# Patient Record
Sex: Male | Born: 1975 | Race: Black or African American | Hispanic: No | Marital: Single | Smoking: Current every day smoker
Health system: Southern US, Community
[De-identification: ages and names within clinical notes are randomized; demographics above are authoritative.]

## PROBLEM LIST (undated history)

## (undated) DIAGNOSIS — L309 Dermatitis, unspecified: Secondary | ICD-10-CM

## (undated) DIAGNOSIS — N200 Calculus of kidney: Secondary | ICD-10-CM

---

## 2004-04-22 ENCOUNTER — Emergency Department (HOSPITAL_COMMUNITY): Admission: EM | Admit: 2004-04-22 | Discharge: 2004-04-22 | Payer: Self-pay | Admitting: Emergency Medicine

## 2004-12-20 ENCOUNTER — Emergency Department (HOSPITAL_COMMUNITY): Admission: EM | Admit: 2004-12-20 | Discharge: 2004-12-20 | Payer: Self-pay | Admitting: Emergency Medicine

## 2004-12-24 ENCOUNTER — Emergency Department (HOSPITAL_COMMUNITY): Admission: EM | Admit: 2004-12-24 | Discharge: 2004-12-24 | Payer: Self-pay | Admitting: Emergency Medicine

## 2009-07-12 ENCOUNTER — Emergency Department (HOSPITAL_COMMUNITY): Admission: EM | Admit: 2009-07-12 | Discharge: 2009-07-12 | Payer: Self-pay | Admitting: Emergency Medicine

## 2010-04-12 ENCOUNTER — Inpatient Hospital Stay (INDEPENDENT_AMBULATORY_CARE_PROVIDER_SITE_OTHER)
Admission: RE | Admit: 2010-04-12 | Discharge: 2010-04-12 | Disposition: A | Discharge status: Home / Self Care | Payer: Self-pay | Source: Ambulatory Visit | Attending: Family Medicine | Admitting: Family Medicine

## 2010-04-12 DIAGNOSIS — S335XXA Sprain of ligaments of lumbar spine, initial encounter: Secondary | ICD-10-CM

## 2010-06-21 ENCOUNTER — Inpatient Hospital Stay (INDEPENDENT_AMBULATORY_CARE_PROVIDER_SITE_OTHER)
Admission: RE | Admit: 2010-06-21 | Discharge: 2010-06-21 | Disposition: A | Discharge status: Home / Self Care | Payer: Self-pay | Source: Ambulatory Visit | Attending: Emergency Medicine | Admitting: Emergency Medicine

## 2010-06-21 DIAGNOSIS — M722 Plantar fascial fibromatosis: Secondary | ICD-10-CM

## 2010-08-25 ENCOUNTER — Ambulatory Visit (INDEPENDENT_AMBULATORY_CARE_PROVIDER_SITE_OTHER): Payer: Self-pay

## 2010-08-25 ENCOUNTER — Inpatient Hospital Stay (INDEPENDENT_AMBULATORY_CARE_PROVIDER_SITE_OTHER)
Admission: RE | Admit: 2010-08-25 | Discharge: 2010-08-25 | Disposition: A | Discharge status: Home / Self Care | Payer: Self-pay | Source: Ambulatory Visit | Attending: Emergency Medicine | Admitting: Emergency Medicine

## 2010-08-25 DIAGNOSIS — S335XXA Sprain of ligaments of lumbar spine, initial encounter: Secondary | ICD-10-CM

## 2011-09-29 ENCOUNTER — Encounter (HOSPITAL_COMMUNITY): Payer: Self-pay | Admitting: *Deleted

## 2011-09-29 ENCOUNTER — Emergency Department (INDEPENDENT_AMBULATORY_CARE_PROVIDER_SITE_OTHER)
Admission: EM | Admit: 2011-09-29 | Discharge: 2011-09-29 | Disposition: A | Discharge status: Home / Self Care | Payer: Self-pay | Source: Home / Self Care | Attending: Family Medicine | Admitting: Family Medicine

## 2011-09-29 DIAGNOSIS — L301 Dyshidrosis [pompholyx]: Secondary | ICD-10-CM

## 2011-09-29 MED ORDER — TRIAMCINOLONE ACETONIDE 0.5 % EX CREA: TOPICAL_CREAM | Freq: Three times a day (TID) | CUTANEOUS | Status: DC

## 2011-09-29 NOTE — ED Notes (Signed)
Pt reports rash to both hands that started over a month ago - some relief with prescription cream given to him by friend - no relief with over the counter remedies.

## 2011-09-29 NOTE — ED Provider Notes (Signed)
History     CSN: [id number]  Arrival date & time 09/29/11  1702   First MD Initiated Contact with Patient 09/29/11 1850      Chief Complaint  Patient presents with  . 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  Patient is a 36 y.o. male presenting with rash. The history is provided by the patient. No language interpreter was used.  Rash  This is a new problem. Episode onset: ~ 1 month ago. The problem has not changed since onset.The problem is associated with nothing. There has been no fever. Affected Location: both hands. The patient is experiencing no pain. Associated symptoms include blisters and itching. Pertinent negatives include no pain and no weeping. Treatments tried: fungal /prednisone cream from a friend provides temporary relief. The treatment provided moderate relief.    History reviewed. No pertinent past medical history.  History reviewed. No pertinent past surgical history.  Family History  Problem Relation Age of Onset  . Family history unknown: Yes    History  Substance Use Topics  . Smoking status: Heavy Tobacco Smoker -- 1.0 packs/day  . Smokeless tobacco: Not on file  . Alcohol Use: Yes     socially      Review of Systems  Constitutional: Negative for fever and chills.  Skin: Positive for itching and rash.  All other systems reviewed and are negative.    Allergies  Penicillins  Home Medications   Current Outpatient Rx  Name Route Sig Dispense Refill  . TRIAMCINOLONE ACETONIDE 0.5 % EX CREA Topical Apply topically 3 (three) times daily. 30 g 0    BP 106/56  Pulse 50  Temp 98.2 F (36.8 C) (Oral)  Resp 16  SpO2 100%  Physical Exam  Nursing note and vitals reviewed. Constitutional: He is oriented to person, place, and time. He appears well-developed and well-nourished.  HENT:  Head: Normocephalic and atraumatic.    Eyes: EOM are normal.  Neck: Normal range of motion.  Cardiovascular: Normal rate, regular rhythm, normal heart sounds and intact distal pulses.   Pulmonary/Chest: Effort normal and breath sounds normal. No respiratory distress.  Abdominal: Soft. He exhibits no distension. There is no tenderness.  Musculoskeletal: Normal range of motion.       Scattered groupings of tiny blistery lesions and areas of thickened skin   Neurological: He is alert and oriented to person, place, and time.  Skin: Skin is warm and dry.  Psychiatric: He has a normal mood and affect. Judgment normal.    ED Course  Procedures (including critical care time)  Labs Reviewed - No data to display No results found.   1. Dyshidrosis       MDM  Triamcinolone cream, 0.5%, 30 gm F/u with tafeen prn         Evalina Field, PA 09/29/11 1940

## 2011-10-03 NOTE — ED Provider Notes (Signed)
Medical screening examination/treatment/procedure(s) were performed by resident physician or non-physician practitioner and as supervising physician I was immediately available for consultation/collaboration.   Jacquelynn Friend DOUGLAS MD.    Marshell Dilauro D Shalese Strahan, MD 10/03/11 0929 

## 2012-01-23 ENCOUNTER — Encounter (HOSPITAL_COMMUNITY): Payer: Self-pay | Admitting: Emergency Medicine

## 2012-01-23 ENCOUNTER — Emergency Department (INDEPENDENT_AMBULATORY_CARE_PROVIDER_SITE_OTHER)
Admission: EM | Admit: 2012-01-23 | Discharge: 2012-01-23 | Disposition: A | Discharge status: Home / Self Care | Payer: Self-pay | Source: Home / Self Care

## 2012-01-23 DIAGNOSIS — L301 Dyshidrosis [pompholyx]: Secondary | ICD-10-CM

## 2012-01-23 MED ORDER — TRIAMCINOLONE ACETONIDE 0.1 % EX CREA: TOPICAL_CREAM | Freq: Two times a day (BID) | CUTANEOUS | Status: DC

## 2012-01-23 NOTE — ED Provider Notes (Signed)
Medical screening examination/treatment/procedure(s) were performed by resident physician or non-physician practitioner and as supervising physician I was immediately available for consultation/collaboration.   Roquel Burgin DOUGLAS MD.    Garret Teale D Yandel Zeiner, MD 01/23/12 2028 

## 2012-01-23 NOTE — ED Notes (Signed)
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

## 2012-01-23 NOTE — ED Provider Notes (Signed)
History     CSN: [id number]  Arrival date & time 01/23/12  1358   First MD Initiated Contact with Patient 01/23/12 1447      Chief Complaint  Patient presents with  . Rash    (Consider location/radiation/quality/duration/timing/severity/associated sxs/prior treatment) HPI Comments: 37 year old man with recurrent and rash. States his been diagnosed with a type of eczema in the past. 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 He has since quit that job he presents now with a scaly slightly red rash in the web spaces in the sides of the digits and the extensor surface of a couple days. Is associated with minor itching. There is no drainage or signs of infection   History reviewed. No pertinent past medical history.  History reviewed. No pertinent past surgical history.  No family history on file.  History  Substance Use Topics  . Smoking status: Heavy Tobacco Smoker -- 1.0 packs/day  . Smokeless tobacco: Not on file  . Alcohol Use: Yes     Comment: socially      Review of Systems  All other systems reviewed and are negative.    Allergies  Penicillins  Home Medications   Current Outpatient Rx  Name  Route  Sig  Dispense  Refill  . TRIAMCINOLONE ACETONIDE 0.1 % EX CREA   Topical   Apply topically 2 (two) times daily.   45 g   0   . TRIAMCINOLONE ACETONIDE 0.5 % EX CREA   Topical   Apply topically 3 (three) times daily.   30 g   0     BP 118/67  Pulse 60  Temp 98.4 F (36.9 C) (Oral)  Resp 18  SpO2 98%  Physical Exam  Constitutional: He is oriented to person, place, and time. He appears well-developed and well-nourished. No distress.  Pulmonary/Chest: Effort normal.  Musculoskeletal: Normal range of motion.  Neurological: He is alert and oriented to person, place, and time.  Skin: Skin is warm and dry. Rash noted.  Psychiatric: He has a normal mood and affect.    ED Course  Procedures (including critical care time)  Labs  Reviewed - No data to display No results found.   1. Dyshidrotic eczema       MDM  Triamcinolone cream 0.1% apply to hands twice a day Followup with a PCP for possible referral to dermatology to the       Hayden Rasmussen, NP 01/23/12 1551

## 2013-07-13 ENCOUNTER — Emergency Department (HOSPITAL_COMMUNITY): Payer: Self-pay

## 2013-07-13 ENCOUNTER — Emergency Department (HOSPITAL_COMMUNITY)
Admission: EM | Admit: 2013-07-13 | Discharge: 2013-07-13 | Disposition: A | Discharge status: Home / Self Care | Payer: Self-pay | Attending: Emergency Medicine | Admitting: Emergency Medicine

## 2013-07-13 ENCOUNTER — Encounter (HOSPITAL_COMMUNITY): Payer: Self-pay | Admitting: Emergency Medicine

## 2013-07-13 DIAGNOSIS — Z880 Allergy status to penicillin: Secondary | ICD-10-CM | POA: Insufficient documentation

## 2013-07-13 DIAGNOSIS — F17200 Nicotine dependence, unspecified, uncomplicated: Secondary | ICD-10-CM | POA: Insufficient documentation

## 2013-07-13 DIAGNOSIS — IMO0002 Reserved for concepts with insufficient information to code with codable children: Secondary | ICD-10-CM | POA: Insufficient documentation

## 2013-07-13 DIAGNOSIS — R1032 Left lower quadrant pain: Secondary | ICD-10-CM | POA: Insufficient documentation

## 2013-07-13 DIAGNOSIS — N200 Calculus of kidney: Secondary | ICD-10-CM | POA: Insufficient documentation

## 2013-07-13 LAB — URINALYSIS, ROUTINE W REFLEX MICROSCOPIC
Bilirubin Urine: NEGATIVE
GLUCOSE, UA: NEGATIVE mg/dL
KETONES UR: NEGATIVE mg/dL
Leukocytes, UA: NEGATIVE
NITRITE: NEGATIVE
Protein, ur: NEGATIVE mg/dL
Specific Gravity, Urine: 1.013 (ref 1.005–1.030)
UROBILINOGEN UA: 0.2 mg/dL (ref 0.0–1.0)
pH: 6 (ref 5.0–8.0)

## 2013-07-13 LAB — I-STAT CHEM 8, ED
BUN: 15 mg/dL (ref 6–23)
CALCIUM ION: 1.14 mmol/L (ref 1.12–1.23)
CHLORIDE: 104 meq/L (ref 96–112)
CREATININE: 1.1 mg/dL (ref 0.50–1.35)
GLUCOSE: 114 mg/dL — AB (ref 70–99)
HCT: 49 % (ref 39.0–52.0)
Hemoglobin: 16.7 g/dL (ref 13.0–17.0)
POTASSIUM: 3.6 meq/L — AB (ref 3.7–5.3)
SODIUM: 143 meq/L (ref 137–147)
TCO2: 23 mmol/L (ref 0–100)

## 2013-07-13 LAB — URINE MICROSCOPIC-ADD ON

## 2013-07-13 MED ORDER — SODIUM CHLORIDE 0.9 % IV BOLUS (SEPSIS)
1000.0000 mL | Freq: Once | INTRAVENOUS | Status: AC
  Administered 2013-07-13: 1000 mL via INTRAVENOUS

## 2013-07-13 MED ORDER — ONDANSETRON 4 MG PO TBDP: ORAL_TABLET | ORAL | Status: DC

## 2013-07-13 MED ORDER — MORPHINE SULFATE 4 MG/ML IJ SOLN
4.0000 mg | Freq: Once | INTRAMUSCULAR | Status: AC
  Administered 2013-07-13: 4 mg via INTRAVENOUS
  Filled 2013-07-13: qty 1

## 2013-07-13 MED ORDER — ONDANSETRON HCL 4 MG/2ML IJ SOLN
4.0000 mg | Freq: Once | INTRAMUSCULAR | Status: AC
  Administered 2013-07-13: 4 mg via INTRAVENOUS
  Filled 2013-07-13: qty 2

## 2013-07-13 MED ORDER — OXYCODONE-ACETAMINOPHEN 5-325 MG PO TABS: 1.0000 | ORAL_TABLET | Freq: Four times a day (QID) | ORAL | Status: DC | PRN

## 2013-07-13 MED ORDER — TAMSULOSIN HCL 0.4 MG PO CAPS: 0.4000 mg | ORAL_CAPSULE | Freq: Every day | ORAL | Status: AC

## 2013-07-13 MED ORDER — NAPROXEN 500 MG PO TABS: 500.0000 mg | ORAL_TABLET | Freq: Two times a day (BID) | ORAL | Status: DC | PRN

## 2013-07-13 MED ORDER — KETOROLAC TROMETHAMINE 30 MG/ML IJ SOLN
30.0000 mg | Freq: Once | INTRAMUSCULAR | Status: AC
  Administered 2013-07-13: 30 mg via INTRAVENOUS
  Filled 2013-07-13: qty 1

## 2013-07-13 NOTE — ED Provider Notes (Signed)
Care assumed from [facility] Damm[facility], PA-C. Awaiting CT to r/o kidney stone.  Physical Exam  BP 121/60  Pulse 54  Temp(Src) 97.8 F (36.6 C) (Oral)  Resp 14  SpO2 100%  Physical Exam Gen: Resting in bed, in NAD. HEENT: [place]/AT Heart: Reg rate, distal pulses intact Lungs: Effort normal Abd: soft, NT/ND  ED Course  Procedures  Results for orders placed during the hospital encounter of 07/13/13  URINALYSIS, ROUTINE W REFLEX MICROSCOPIC      Result Value Ref Range   Color, Urine YELLOW  YELLOW   APPearance CLEAR  CLEAR   Specific Gravity, Urine 1.013  1.005 - 1.030   pH 6.0  5.0 - 8.0   Glucose, UA NEGATIVE  NEGATIVE mg/dL   Hgb urine dipstick SMALL (*) NEGATIVE   Bilirubin Urine NEGATIVE  NEGATIVE   Ketones, ur NEGATIVE  NEGATIVE mg/dL   Protein, ur NEGATIVE  NEGATIVE mg/dL   Urobilinogen, UA 0.2  0.0 - 1.0 mg/dL   Nitrite NEGATIVE  NEGATIVE   Leukocytes, UA NEGATIVE  NEGATIVE  URINE MICROSCOPIC-ADD ON      Result Value Ref Range   WBC, UA 0-2  <3 WBC/hpf   RBC / HPF 0-2  <3 RBC/hpf   Bacteria, UA RARE  RARE  I-STAT CHEM 8, ED      Result Value Ref Range   Sodium 143  137 - 147 mEq/L   Potassium 3.6 (*) 3.7 - 5.3 mEq/L   Chloride 104  96 - 112 mEq/L   BUN 15  6 - 23 mg/dL   Creatinine, Ser 1.611.10  0.50 - 1.35 mg/dL   Glucose, Bld 096114 (*) 70 - 99 mg/dL   Calcium, Ion 0.451.14  4.091.12 - 1.23 mmol/L   TCO2 23  0 - 100 mmol/L   Hemoglobin 16.7  13.0 - 17.0 g/dL   HCT 81.149.0  91.439.0 - 78.252.0 %   Ct Abdomen Pelvis Wo Contrast  07/13/2013   CLINICAL DATA:  Left testicular pain.  Microhematuria.  EXAM: CT ABDOMEN AND PELVIS WITHOUT CONTRAST  TECHNIQUE: Multidetector CT imaging of the abdomen and pelvis was performed following the standard protocol without IV contrast.  COMPARISON:  None.  FINDINGS: Dependent atelectasis in the lung bases.  There is a 3 mm stone in the distal left ureter at the level of the ureterovesical junction. There is mild proximal ureterectasis and pyelocaliectasis on the  left. Mild periureteral stranding distally. No renal or ureteral stones on the right. Bladder wall is not thickened.  Unenhanced appearance of the liver, spleen, gallbladder, pancreas, adrenal glands, abdominal aorta, inferior vena cava, and retroperitoneal lymph nodes is unremarkable. Stomach, small bowel, and colon are mostly decompressed. Scattered stool in the colon. No free air or free fluid in the abdomen. Abdominal wall musculature appears intact.  Pelvis: The appendix is normal. No free or loculated pelvic fluid collections. Scattered calcified pelvic phleboliths. Calcification in the as deferens. Prostate gland is not enlarged. No free or loculated pelvic fluid collections. No findings to suggest diverticulitis. No significant pelvic lymphadenopathy. Mild degenerative changes in the lumbar spine. No destructive bone lesions.  IMPRESSION: 3 mm stone in the distal left ureter with mild to moderate proximal obstruction.   Electronically Signed   By: Burman NievesWilliam  Stevens M.D.   On: 07/13/2013 06:59   [place] Scrotum  07/13/2013   CLINICAL DATA:  Left testicular pain to the back since 07/12/2013.  EXAM: SCROTAL ULTRASOUND  DOPPLER ULTRASOUND OF THE TESTICLES  TECHNIQUE: Complete ultrasound examination of  the testicles, epididymis, and other scrotal structures was performed. Color and spectral Doppler ultrasound were also utilized to evaluate blood flow to the testicles.  COMPARISON:  None.  FINDINGS: Right testicle  Measurements: 4.6 x 3.3 x 3.8 cm. No mass or microlithiasis visualized.  Left testicle  Measurements: 4.5 x 3.2 x 2.9 cm. No mass or microlithiasis visualized.  Right epididymis: Small calcification could represent scrotal pleural or phleboliths. Normal size in flow to the epididymis.  Left epididymis:  Normal in size and appearance.  Hydrocele:  Minimal left scrotal hydrocele.  Pulsed Doppler interrogation of both testes demonstrates low resistance arterial and venous waveforms bilaterally. Homogeneous  symmetrical color flow to both testes and epididymides.  IMPRESSION: Normal ultrasound appearance of the testicles. No evidence of mass or torsion.   Electronically Signed   By: Burman NievesWilliam  Stevens M.D.   On: 07/13/2013 04:13   [place] Art/ven Flow Abd Pelv Doppler  07/13/2013   CLINICAL DATA:  Left testicular pain to the back since 07/12/2013.  EXAM: SCROTAL ULTRASOUND  DOPPLER ULTRASOUND OF THE TESTICLES  TECHNIQUE: Complete ultrasound examination of the testicles, epididymis, and other scrotal structures was performed. Color and spectral Doppler ultrasound were also utilized to evaluate blood flow to the testicles.  COMPARISON:  None.  FINDINGS: Right testicle  Measurements: 4.6 x 3.3 x 3.8 cm. No mass or microlithiasis visualized.  Left testicle  Measurements: 4.5 x 3.2 x 2.9 cm. No mass or microlithiasis visualized.  Right epididymis: Small calcification could represent scrotal pleural or phleboliths. Normal size in flow to the epididymis.  Left epididymis:  Normal in size and appearance.  Hydrocele:  Minimal left scrotal hydrocele.  Pulsed Doppler interrogation of both testes demonstrates low resistance arterial and venous waveforms bilaterally. Homogeneous symmetrical color flow to both testes and epididymides.  IMPRESSION: Normal ultrasound appearance of the testicles. No evidence of mass or torsion.   Electronically Signed   By: Burman NievesWilliam  Stevens M.D.   On: 07/13/2013 04:13    MDM   ICD-9-CM  1. Kidney stone on left side 592.0    Jeremy Dixon is a 38 y.o. male who presented for testicular pain. Signed out to me by Ivonne AndrewPeter Dammen, awaiting CT. CT demonstrates 3mm distal L ureter at UVJ. Pain controlled with Morphine 4mg , although pt states it's returning now. Will give Morphine 4mg  and Toradol 30mg  IV. Plan to d/c with urology f/up, give urine strainer, and rx for percocet, zofran, naprosyn, and flomax. I explained the diagnosis and have given explicit precautions to return to the ER including for any  other new or worsening symptoms. The patient understands and accepts the medical plan as it's been dictated and I have answered their questions. Discharge instructions concerning home care and prescriptions have been given. The patient is STABLE and is discharged to home in good condition.  BP 121/60  Pulse 54  Temp(Src) 97.8 F (36.6 C) (Oral)  Resp 14  SpO2 100%       Celanese CorporationMercedes Strupp Camprubi-Soms, PA-C 07/13/13 [phone]

## 2013-07-13 NOTE — Discharge Instructions (Signed)
Take naprosyn as directed as needed for inflammation and pain using Percocet for breakthrough pain. Do not drive or operate machinery with pain medication use. May need over-the-counter stool softener with this pain medication use. Followup with urologist in the next 1week for recheck of ongoing pain, however for intractable or uncontrollable pain at home then return to the emergency department. Use Zofran as needed for nausea. Use Flomax as directed, as this medication will help you pass the stone.   Kidney Stones Kidney stones (urolithiasis) are solid masses that form inside your kidneys. The intense pain is caused by the stone moving through the kidney, ureter, bladder, and urethra (urinary tract). When the stone moves, the ureter starts to spasm around the stone. The stone is usually passed in your pee (urine).  HOME CARE  Drink enough fluids to keep your pee clear or pale yellow. This helps to get the stone out.  Strain all pee through the provided strainer. Do not pee without peeing through the strainer, not even once. If you pee the stone out, catch it in the strainer. The stone may be as small as a grain of salt. Take this to your doctor. This will help your doctor figure out what you can do to try to prevent more kidney stones.  Only take medicine as told by your doctor.  Follow up with your doctor as told.  Get follow-up X-rays as told by your doctor. GET HELP IF: You have pain that gets worse even if you have been taking pain medicine. GET HELP RIGHT AWAY IF:   Your pain does not get better with medicine.  You have a fever or shaking chills.  Your pain increases and gets worse over 18 hours.  You have new belly (abdominal) pain.  You feel faint or pass out.  You are unable to pee. MAKE SURE YOU:   Understand these instructions.  Will watch your condition.  Will get help right away if you are not doing well or get worse. Document Released: 06/11/2007 Document Revised:  08/25/2012 Document Reviewed: 05/26/2012 [facility] Patient Information 2015 [place]ExitCare, [place]. This information is not intended to replace advice given to you by your health care provider. Make sure you discuss any questions you have with your health care provider.  Dietary Guidelines to Help Prevent Kidney Stones Your risk of kidney stones can be decreased by adjusting the foods you eat. The most important thing you can do is drink enough fluid. You should drink enough fluid to keep your urine clear or pale yellow. The following guidelines provide specific information for the type of kidney stone you have had. GUIDELINES ACCORDING TO TYPE OF KIDNEY STONE Calcium Oxalate Kidney Stones  Reduce the amount of salt you eat. Foods that have a lot of salt cause your body to release excess calcium into your urine. The excess calcium can combine with a substance called oxalate to form kidney stones.  Reduce the amount of animal protein you eat if the amount you eat is excessive. Animal protein causes your body to release excess calcium into your urine. Ask your dietitian how much protein from animal sources you should be eating.  Avoid foods that are high in oxalates. If you take vitamins, they should have less than 500 mg of vitamin C. Your body turns vitamin C into oxalates. You do not need to avoid fruits and vegetables high in vitamin C. Calcium Phosphate Kidney Stones  Reduce the amount of salt you eat to help prevent the release  of excess calcium into your urine.  Reduce the amount of animal protein you eat if the amount you eat is excessive. Animal protein causes your body to release excess calcium into your urine. Ask your dietitian how much protein from animal sources you should be eating.  Get enough calcium from food or take a calcium supplement (ask your dietitian for recommendations). Food sources of calcium that do not increase your risk of kidney stones include:  Broccoli.  Dairy products,  such as cheese and yogurt.  Pudding. Uric Acid Kidney Stones  Do not have more than 6 oz of animal protein per day. FOOD SOURCES Animal Protein Sources  Meat (all types).  Poultry.  Eggs.  Fish, seafood. Foods High in MirantSalt  Salt seasonings.  Soy sauce.  Teriyaki sauce.  Cured and processed meats.  Salted crackers and snack foods.  Fast food.  Canned soups and most canned foods. Foods High in Oxalates  Grains:  Amaranth.  Barley.  Grits.  Wheat germ.  Bran.  Buckwheat flour.  All bran cereals.  Pretzels.  Whole wheat bread.  Vegetables:  Beans (wax).  Beets and beet greens.  Collard greens.  Eggplant.  [place]ole.  Leeks.  Okra.  Parsley.  Rutabagas.  Spinach.  Swiss chard.  Tomato paste.  Fried potatoes.  Sweet potatoes.  Fruits:  Red currants.  Figs.  Kiwi.  Rhubarb.  Meat and Other Protein Sources:  Beans (dried).  Soy burgers and other soybean products.  Miso.  Nuts (peanuts, almonds, pecans, cashews, hazelnuts).  Nut butters.  Sesame seeds and tahini (paste made of sesame seeds).  Poppy seeds.  Beverages:  Chocolate drink mixes.  Soy milk.  Instant iced tea.  Juices made from high-oxalate fruits or vegetables.  Other:  Carob.  Chocolate.  Fruitcake.  Marmalades. Document Released: 04/19/2010 Document Revised: 12/28/2012 Document Reviewed: 11/19/2012 [facility]Exit[facility] Patient Information 2015 [place]ExitCare, [place]LLC. This information is not intended to replace advice given to you by your health care provider. Make sure you discuss any questions you have with your health care provider.  Urine Strainer This strainer is used to catch or filter out any stones found in your urine. Place the strainer under your urine stream. Save any stones or objects that you find in your urine. Place them in a plastic or glass container to show your caregiver. The stones vary in size - some can be very small, so make sure  you check the strainer carefully. Your caregiver may send the stone to the lab. When the results are back, your caregiver may recommend medicines or diet changes.  Document Released: 09/28/2003 Document Revised: 03/17/2011 Document Reviewed: 11/05/2007 [facility] LLCExitCare Patient Information 2015 [place]ExitCare, [place]LLC. This information is not intended to replace advice given to you by your health care provider. Make sure you discuss any questions you have with your health care provider.

## 2013-07-13 NOTE — ED Notes (Signed)
Ultrasound here for exam.  

## 2013-07-13 NOTE — ED Notes (Signed)
Pt complains of left testicle pain that woke him up last night

## 2013-07-13 NOTE — ED Notes (Signed)
Bed: WA03 Expected date:  Expected time:  Means of arrival:  Comments: 

## 2013-07-13 NOTE — ED Notes (Signed)
Pt resting, in no acute distress waiting on CT

## 2013-07-13 NOTE — ED Provider Notes (Signed)
CSN: [id number]     Arrival date & time 07/13/13  0247 History   None    Chief Complaint  Patient presents with  . Testicle Pain   HPI  History provided by the patient. Patient is a 38 year old male with no significant PMH presenting with complaints of chill he worsens left testicle pain. Patient reports first awakening yesterday morning with a some sharp pains in the left testicle and scrotum area. This persisted throughout the whole day some waxing and waning. He reports that it never completely went away. He didn't take Tylenol without much relief. Early this morning he awoke again with even worsened sharp pains in the left testicle. Pain radiates into the left low back and abdomen. Denies having similar symptoms previously. Denies any dysuria, hematuria he urinary frequency or penile discharge. Does feel there may have been some increased swelling of the left testicle.    History reviewed. No pertinent past medical history. History reviewed. No pertinent past surgical history. History reviewed. No pertinent family history. History  Substance Use Topics  . Smoking status: Heavy Tobacco Smoker -- 1.00 packs/day  . Smokeless tobacco: Not on file  . Alcohol Use: Yes     Comment: socially    Review of Systems  Constitutional: Negative for fever.  Gastrointestinal: Positive for nausea and abdominal pain. Negative for vomiting.  Genitourinary: Positive for scrotal swelling and testicular pain. Negative for dysuria, frequency, hematuria, discharge, penile swelling and penile pain.  Musculoskeletal: Positive for back pain.  All other systems reviewed and are negative.     Allergies  Penicillins  Home Medications   Prior to Admission medications   Medication Sig Start Date End Date Taking? Authorizing Provider  triamcinolone cream (KENALOG) 0.1 % Apply topically 2 (two) times daily. 01/23/12   Hayden Rasmussen, NP  triamcinolone cream (KENALOG) 0.5 % Apply topically 3 (three) times daily.  09/29/11   Richard Hyacinth Meeker, PA-C   BP 124/67  Pulse 52  Temp(Src) 98.3 F (36.8 C) (Oral)  Resp 16  SpO2 100% Physical Exam  Nursing note and vitals reviewed. Constitutional: He is oriented to person, place, and time. He appears well-developed and well-nourished. He appears distressed.  HENT:  Head: Normocephalic.  Cardiovascular: Normal rate and regular rhythm.   Pulmonary/Chest: Effort normal and breath sounds normal. No respiratory distress. He has no wheezes.  Abdominal: Soft. There is tenderness in the left upper quadrant and left lower quadrant. There is guarding. There is no rigidity, no rebound, no CVA tenderness, no tenderness at McBurney's point and negative Murphy's sign. Hernia confirmed negative in the right inguinal area and confirmed negative in the left inguinal area.  Genitourinary: Testes normal and penis normal. Right testis shows no tenderness. Left testis shows no tenderness.  There is tenderness in the left epididymis area. No masses or testicles. No testicular pain. Scrotum normal.  Lymphadenopathy:       Right: No inguinal adenopathy present.       Left: No inguinal adenopathy present.  Neurological: He is alert and oriented to person, place, and time.  Skin: Skin is warm.  Psychiatric: He has a normal mood and affect. His behavior is normal.    ED Course  Procedures   COORDINATION OF CARE:  Nursing notes reviewed. Vital signs reviewed. Initial pt interview and examination performed.   Filed Vitals:   07/13/13 0252  BP: 124/67  Pulse: 52  Temp: 98.3 F (36.8 C)  TempSrc: Oral  Resp: 16  SpO2: 100%  3:12 AM- patient seen and evaluated. Patient appears in pain and discomforts. Afebrile. Pain has been worsening for the past 24 hours. Atypical for acute testicular torsion however there is tenderness around the left testicle and epididymis. Will obtain ultrasound, UA and i-STAT chem 8  Patient feeling much better after medications. No signs of torsion  on ultrasound. No other concerning findings.  Small amount of hematuria present. Will obtain CT to r/o kidney stone.  Pt discussed in sign out with CAMPRUBI-SOMS, MERCEDES STRUPP.  She will follow CT results.   Treatment plan initiated: Medications  sodium chloride 0.9 % bolus 1,000 mL (not administered)  morphine 4 MG/ML injection 4 mg (not administered)  ondansetron (ZOFRAN) injection 4 mg (not administered)    Results for orders placed during the hospital encounter of 07/13/13  URINALYSIS, ROUTINE W REFLEX MICROSCOPIC      Result Value Ref Range   Color, Urine YELLOW  YELLOW   APPearance CLEAR  CLEAR   Specific Gravity, Urine 1.013  1.005 - 1.030   pH 6.0  5.0 - 8.0   Glucose, UA NEGATIVE  NEGATIVE mg/dL   Hgb urine dipstick SMALL (*) NEGATIVE   Bilirubin Urine NEGATIVE  NEGATIVE   Ketones, ur NEGATIVE  NEGATIVE mg/dL   Protein, ur NEGATIVE  NEGATIVE mg/dL   Urobilinogen, UA 0.2  0.0 - 1.0 mg/dL   Nitrite NEGATIVE  NEGATIVE   Leukocytes, UA NEGATIVE  NEGATIVE  URINE MICROSCOPIC-ADD ON      Result Value Ref Range   WBC, UA 0-2  <3 WBC/hpf   RBC / HPF 0-2  <3 RBC/hpf   Bacteria, UA RARE  RARE  I-STAT CHEM 8, ED      Result Value Ref Range   Sodium 143  137 - 147 mEq/L   Potassium 3.6 (*) 3.7 - 5.3 mEq/L   Chloride 104  96 - 112 mEq/L   BUN 15  6 - 23 mg/dL   Creatinine, Ser 1.611.10  0.50 - 1.35 mg/dL   Glucose, Bld 096114 (*) 70 - 99 mg/dL   Calcium, Ion 0.451.14  4.091.12 - 1.23 mmol/L   TCO2 23  0 - 100 mmol/L   Hemoglobin 16.7  13.0 - 17.0 g/dL   HCT 81.149.0  91.439.0 - 78.252.0 %      Imaging Review [place] Scrotum  07/13/2013   CLINICAL DATA:  Left testicular pain to the back since 07/12/2013.  EXAM: SCROTAL ULTRASOUND  DOPPLER ULTRASOUND OF THE TESTICLES  TECHNIQUE: Complete ultrasound examination of the testicles, epididymis, and other scrotal structures was performed. Color and spectral Doppler ultrasound were also utilized to evaluate blood flow to the testicles.  COMPARISON:  None.   FINDINGS: Right testicle  Measurements: 4.6 x 3.3 x 3.8 cm. No mass or microlithiasis visualized.  Left testicle  Measurements: 4.5 x 3.2 x 2.9 cm. No mass or microlithiasis visualized.  Right epididymis: Small calcification could represent scrotal pleural or phleboliths. Normal size in flow to the epididymis.  Left epididymis:  Normal in size and appearance.  Hydrocele:  Minimal left scrotal hydrocele.  Pulsed Doppler interrogation of both testes demonstrates low resistance arterial and venous waveforms bilaterally. Homogeneous symmetrical color flow to both testes and epididymides.  IMPRESSION: Normal ultrasound appearance of the testicles. No evidence of mass or torsion.   Electronically Signed   By: Burman NievesWilliam  Stevens M.D.   On: 07/13/2013 04:13   [place] Art/ven Flow Abd Pelv Doppler  07/13/2013   CLINICAL DATA:  Left testicular pain to the  back since 07/12/2013.  EXAM: SCROTAL ULTRASOUND  DOPPLER ULTRASOUND OF THE TESTICLES  TECHNIQUE: Complete ultrasound examination of the testicles, epididymis, and other scrotal structures was performed. Color and spectral Doppler ultrasound were also utilized to evaluate blood flow to the testicles.  COMPARISON:  None.  FINDINGS: Right testicle  Measurements: 4.6 x 3.3 x 3.8 cm. No mass or microlithiasis visualized.  Left testicle  Measurements: 4.5 x 3.2 x 2.9 cm. No mass or microlithiasis visualized.  Right epididymis: Small calcification could represent scrotal pleural or phleboliths. Normal size in flow to the epididymis.  Left epididymis:  Normal in size and appearance.  Hydrocele:  Minimal left scrotal hydrocele.  Pulsed Doppler interrogation of both testes demonstrates low resistance arterial and venous waveforms bilaterally. Homogeneous symmetrical color flow to both testes and epididymides.  IMPRESSION: Normal ultrasound appearance of the testicles. No evidence of mass or torsion.   Electronically Signed   By: Burman NievesWilliam  Stevens M.D.   On: 07/13/2013 04:13     MDM    Final diagnoses:  None        Angus Sellereter S Keyshla Tunison, PA-C 07/13/13 [id number]

## 2013-07-13 NOTE — ED Notes (Signed)
I awoke him from sleep to assess him.  He is in no distress; and states he has a remnant of the left testicular/groin pain remaining.

## 2013-07-16 NOTE — ED Provider Notes (Signed)
Medical screening examination/treatment/procedure(s) were performed by non-physician practitioner and as supervising physician I was immediately available for consultation/collaboration.   EKG Interpretation None        Hallie Ishida, MD 07/16/13 0656 

## 2013-07-16 NOTE — ED Provider Notes (Signed)
Medical screening examination/treatment/procedure(s) were conducted as a shared visit with non-physician practitioner(s) and myself.  I personally evaluated the patient during the encounter.   EKG Interpretation None      Pt seen and examined.  Agree with  PA note. Pt with  Sudden pain.  Studies show distal stone.  Tolerating symptoms well. Benign exam.. DC c Urology F/U.  Rolland PorterMark Minas Bonser, MD 07/16/13 [phone]

## 2014-02-03 ENCOUNTER — Emergency Department (HOSPITAL_COMMUNITY): Payer: Self-pay

## 2014-02-03 ENCOUNTER — Emergency Department (HOSPITAL_COMMUNITY)
Admission: EM | Admit: 2014-02-03 | Discharge: 2014-02-03 | Disposition: A | Discharge status: Home / Self Care | Payer: Self-pay | Attending: Emergency Medicine | Admitting: Emergency Medicine

## 2014-02-03 ENCOUNTER — Encounter (HOSPITAL_COMMUNITY): Payer: Self-pay | Admitting: *Deleted

## 2014-02-03 DIAGNOSIS — W1839XA Other fall on same level, initial encounter: Secondary | ICD-10-CM | POA: Insufficient documentation

## 2014-02-03 DIAGNOSIS — Y9389 Activity, other specified: Secondary | ICD-10-CM | POA: Insufficient documentation

## 2014-02-03 DIAGNOSIS — Y9289 Other specified places as the place of occurrence of the external cause: Secondary | ICD-10-CM | POA: Insufficient documentation

## 2014-02-03 DIAGNOSIS — Z79899 Other long term (current) drug therapy: Secondary | ICD-10-CM | POA: Insufficient documentation

## 2014-02-03 DIAGNOSIS — S62309A Unspecified fracture of unspecified metacarpal bone, initial encounter for closed fracture: Secondary | ICD-10-CM

## 2014-02-03 DIAGNOSIS — Z720 Tobacco use: Secondary | ICD-10-CM | POA: Insufficient documentation

## 2014-02-03 DIAGNOSIS — S62316A Displaced fracture of base of fifth metacarpal bone, right hand, initial encounter for closed fracture: Secondary | ICD-10-CM | POA: Insufficient documentation

## 2014-02-03 DIAGNOSIS — Z87442 Personal history of urinary calculi: Secondary | ICD-10-CM | POA: Insufficient documentation

## 2014-02-03 DIAGNOSIS — R361 Hematospermia: Secondary | ICD-10-CM | POA: Insufficient documentation

## 2014-02-03 DIAGNOSIS — Y998 Other external cause status: Secondary | ICD-10-CM | POA: Insufficient documentation

## 2014-02-03 DIAGNOSIS — Z880 Allergy status to penicillin: Secondary | ICD-10-CM | POA: Insufficient documentation

## 2014-02-03 HISTORY — DX: Calculus of kidney: N20.0

## 2014-02-03 LAB — URINALYSIS, ROUTINE W REFLEX MICROSCOPIC
BILIRUBIN URINE: NEGATIVE
Glucose, UA: NEGATIVE mg/dL
Hgb urine dipstick: NEGATIVE
Ketones, ur: NEGATIVE mg/dL
Leukocytes, UA: NEGATIVE
NITRITE: NEGATIVE
Protein, ur: NEGATIVE mg/dL
SPECIFIC GRAVITY, URINE: 1.025 (ref 1.005–1.030)
Urobilinogen, UA: 1 mg/dL (ref 0.0–1.0)
pH: 6 (ref 5.0–8.0)

## 2014-02-03 MED ORDER — OXYCODONE-ACETAMINOPHEN 5-325 MG PO TABS: 2.0000 | ORAL_TABLET | ORAL | Status: AC | PRN

## 2014-02-03 NOTE — ED Provider Notes (Signed)
CSN: [id number]     Arrival date & time 02/03/14  1237 History   First MD Initiated Contact with Patient 02/03/14 1455     Chief Complaint  Patient presents with  . Flank Pain  . Arm Pain  . Shoulder Pain     (Consider location/radiation/quality/duration/timing/severity/associated sxs/prior Treatment) HPI Comments: Patient here complaining of blood in his ejaculate after he had sex this morning. Patient states that he had a kidney stone recently and was treated for this with medications which she has been compliant with. States that the blood cleared up quickly as he urinated. Denies any fever or chills. No dysuria. No current flank pain. Denies any abdominal pain.  Patient also complains of pain to his right hand after he fell a week ago. He initially had swelling which resolved spontaneously. Complains of sharp pain worse with movement and worse at the base of the fifth metacarpal. No fever chills associated with this. Pain that was remaining still. Denies any numbness or tingling to his right hand. No discoloration noted.  Patient is a 39 y.o. male presenting with flank pain, arm pain, and shoulder pain. The history is provided by the patient.  Flank Pain  Arm Pain  Shoulder Pain   Past Medical History  Diagnosis Date  . Kidney stone    History reviewed. No pertinent past surgical history. No family history on file. History  Substance Use Topics  . Smoking status: Current Every Day Smoker -- 1.00 packs/day  . Smokeless tobacco: Not on file  . Alcohol Use: Yes     Comment: 2x month    Review of Systems  Genitourinary: Positive for flank pain.  All other systems reviewed and are negative.     Allergies  Penicillins  Home Medications   Prior to Admission medications   Medication Sig Start Date End Date Taking? Authorizing Provider  acetaminophen (TYLENOL) 500 MG tablet Take 1,000 mg by mouth every 6 (six) hours as needed for mild pain.   Yes Historical Provider, MD   naproxen (NAPROSYN) 500 MG tablet Take 1 tablet (500 mg total) by mouth 2 (two) times daily as needed for mild pain, moderate pain or headache (TAKE WITH MEALS.). Patient not taking: Reported on 02/03/2014 07/13/13   Donnita [place] Camprubi-Soms, PA-C  ondansetron (ZOFRAN ODT) 4 MG disintegrating tablet  ODT q8 hours prn nausea/vomit Patient not taking: Reported on 02/03/2014 07/13/13   [place]ita [place] Camprubi-Soms, PA-C  oxyCODONE-acetaminophen (PERCOCET) 5-325 MG per tablet Take 1-2 tablets by mouth every 6 (six) hours as needed for severe pain. Patient not taking: Reported on 02/03/2014 07/13/13   [place]ita [place] Camprubi-Soms, PA-C  tamsulosin (FLOMAX) 0.4 MG CAPS capsule Take 1 capsule (0.4 mg total) by mouth daily after supper. Patient not taking: Reported on 02/03/2014 07/13/13   [place]ita [place] Camprubi-Soms, PA-C   There were no vitals taken for this visit. Physical Exam  Constitutional: He is oriented to person, place, and time. He appears well-developed and well-nourished.  Non-toxic appearance. No distress.  HENT:  Head: Normocephalic and atraumatic.  Eyes: Conjunctivae, EOM and lids are normal. Pupils are equal, round, and reactive to light.  Neck: Normal range of motion. Neck supple. No tracheal deviation present. No thyroid mass present.  Cardiovascular: Normal rate, regular rhythm and normal heart sounds.  Exam reveals no gallop.   No murmur heard. Pulmonary/Chest: Effort normal and breath sounds normal. No stridor. No respiratory distress. He has no decreased breath sounds. He has no wheezes. He has no rhonchi. He  has no rales.  Abdominal: Soft. Normal appearance and bowel sounds are normal. He exhibits no distension. There is no tenderness. There is no rebound and no CVA tenderness.  Musculoskeletal: Normal range of motion. He exhibits no edema or tenderness.       Right shoulder: He exhibits pain.       Arms: Neurological: He is alert and oriented to person, place, and time. He  has normal strength. No cranial nerve deficit or sensory deficit. GCS eye subscore is 4. GCS verbal subscore is 5. GCS motor subscore is 6.  Skin: Skin is warm and dry. No abrasion and no rash noted.  Psychiatric: He has a normal mood and affect. His speech is normal and behavior is normal.  Nursing note and vitals reviewed.   ED Course  Procedures (including critical care time) Labs Review Labs Reviewed  URINALYSIS, ROUTINE W REFLEX MICROSCOPIC    Imaging Review Dg Shoulder Right  02/03/2014   CLINICAL DATA:  Acute right shoulder pain after punching a window and falling 1 week ago. Initial encounter.  EXAM: RIGHT SHOULDER - 2+ VIEW  COMPARISON:  None.  FINDINGS: There is no evidence of fracture or dislocation. There is no evidence of arthropathy or other focal bone abnormality. Soft tissues are unremarkable.  IMPRESSION: Normal right shoulder.   Electronically Signed   By: Roque LiasJames  Green M.D.   On: 02/03/2014 13:51   Dg Forearm Right  02/03/2014   CLINICAL DATA:  Status post punching injury and subsequent fall 1 week ago ; right hand pain radiating into the forearm  EXAM: RIGHT FOREARM - 2 VIEW  COMPARISON:  Right hand series of today's date.  FINDINGS: There is a known comminuted fracture of the base of the fifth metacarpal. The carpal bones appear intact. The radius and ulna exhibit no acute fracture nor dislocation. The soft tissues of the forearm are unremarkable.  IMPRESSION: There is no acute bony abnormality of the right radius or ulna.   Electronically Signed   By: David  SwazilandJordan   On: 02/03/2014 13:52   Dg Hand Complete Right  02/03/2014   CLINICAL DATA:  Punching injury of the hand and subsequent fall 1 week ago. ; abrasion at the base of the fourth and fifth finger  EXAM: RIGHT HAND - COMPLETE 3+ VIEW  COMPARISON:  None.  FINDINGS: There is a comminuted fracture of the base of the fifth metacarpal. There is mild overlying soft tissue swelling. The first through fourth metacarpals are  intact. The phalanges are intact. The interphalangeal and metacarpophalangeal joints are preserved. The observed intercarpal joints are unremarkable.  IMPRESSION: There is a comminuted fracture of the base of the fifth metacarpal.   Electronically Signed   By: David  [place]   On: 02/03/2014 13:51     EKG Interpretation None      MDM   Final diagnoses:  None    Splint will be applied by orthopedics. Urinalysis is clear. Will give patient referral to urology as well as a hand surgeon on-call. No evidence of joint infection at this time.    Toy BakerAnthony T Orvilla Truett, MD 02/03/14 1537

## 2014-02-03 NOTE — Discharge Instructions (Signed)
Follow up with the urologist about the blood in your sperm Boxer's Fracture You have a break (fracture) of the fifth metacarpal bone. This is commonly called a boxer's fracture. This is the bone in the hand where the little finger attaches. The fracture is in the end of that bone, closest to the little finger. It is usually caused when you hit an object with a clenched fist. Often, the knuckle is pushed down by the impact. Sometimes, the fracture rotates out of position. A boxer's fracture will usually heal within 6 weeks, if it is treated properly and protected from re-injury. Surgery is sometimes needed. A cast, splint, or bulky hand dressing may be used to protect and immobilize a boxer's fracture. Do not remove this device or dressing until your caregiver approves. Keep your hand elevated, and apply ice packs for 15-20 minutes every 2 hours, for the first 2 days. Elevation and ice help reduce swelling and relieve pain. See your caregiver, or an orthopedic specialist, for follow-up care within the next 10 days. This is to make sure your fracture is healing properly. Document Released: 12/23/2004 Document Revised: 03/17/2011 Document Reviewed: 06/12/2006 [facility] Patient Information 2015 [place], [place]. This information is not intended to replace advice given to you by your health care provider. Make sure you discuss any questions you have with your health care provider.  Cast or Splint Care Casts and splints support injured limbs and keep bones from moving while they heal. It is important to care for your cast or splint at home.  HOME CARE INSTRUCTIONS  Keep the cast or splint uncovered during the drying period. It can take 24 to 48 hours to dry if it is made of plaster. A fiberglass cast will dry in less than 1 hour.  Do not rest the cast on anything harder than a pillow for the first 24 hours.  Do not put weight on your injured limb or apply pressure to the cast until your health care provider  gives you permission.  Keep the cast or splint dry. Wet casts or splints can lose their shape and may not support the limb as well. A wet cast that has lost its shape can also create harmful pressure on your skin when it dries. Also, wet skin can become infected.  Cover the cast or splint with a plastic bag when bathing or when out in the rain or snow. If the cast is on the trunk of the body, take sponge baths until the cast is removed.  If your cast does become wet, dry it with a towel or a blow dryer on the cool setting only.  Keep your cast or splint clean. Soiled casts may be wiped with a moistened cloth.  Do not place any hard or soft foreign objects under your cast or splint, such as cotton, toilet paper, lotion, or powder.  Do not try to scratch the skin under the cast with any object. The object could get stuck inside the cast. Also, scratching could lead to an infection. If itching is a problem, use a blow dryer on a cool setting to relieve discomfort.  Do not trim or cut your cast or remove padding from inside of it.  Exercise all joints next to the injury that are not immobilized by the cast or splint. For example, if you have a long leg cast, exercise the hip joint and toes. If you have an arm cast or splint, exercise the shoulder, elbow, thumb, and fingers.  Elevate your  injured arm or leg on 1 or 2 pillows for the first 1 to 3 days to decrease swelling and pain.It is best if you can comfortably elevate your cast so it is higher than your heart. SEEK MEDICAL CARE IF:   Your cast or splint cracks.  Your cast or splint is too tight or too loose.  You have unbearable itching inside the cast.  Your cast becomes wet or develops a soft spot or area.  You have a bad smell coming from inside your cast.  You get an object stuck under your cast.  Your skin around the cast becomes red or raw.  You have new pain or worsening pain after the cast has been applied. SEEK IMMEDIATE  MEDICAL CARE IF:   You have fluid leaking through the cast.  You are unable to move your fingers or toes.  You have discolored (blue or white), cool, painful, or very swollen fingers or toes beyond the cast.  You have tingling or numbness around the injured area.  You have severe pain or pressure under the cast.  You have any difficulty with your breathing or have shortness of breath.  You have chest pain. Document Released: 12/21/1999 Document Revised: 10/13/2012 Document Reviewed: 07/01/2012 [facility] Patient Information 2015 [place]. This information is not intended to replace advice given to you by your health care provider. Make sure you discuss any questions you have with your health care provider.

## 2014-02-03 NOTE — ED Notes (Signed)
Pt reports L sided flank pain x1 week. Sts it got excrutiatingly worse when ejaculating today, which he reports ejaculate was blood. Sts since then, urine was bloody first then has been clearing up more each time he urinates. Hx kidney stone and he thinks this could be related. Also c/o R shoulder, arm and hand pain 2/2 fall last week on ice. Pain worst in R hand, but c/o shoulder and arm pain as well. Feels like something may be broken.

## 2015-06-30 IMAGING — CT CT ABD-PELV W/O CM
1 series · 15 of 24 positions shown, 19 images · non-contrast
Comparison: None.

CLINICAL DATA: Left testicular pain.  Microhematuria.

EXAM:
CT ABDOMEN AND PELVIS WITHOUT CONTRAST
TECHNIQUE: Multidetector CT imaging of the abdomen and pelvis was performed
following the standard protocol without IV contrast.

[Series 3: lung · axial · 0.74mm/px · z∈[+1428,+1534]mm · 15 of 24 slices shown, 19 images]
[im 2/24  soft-tissue]
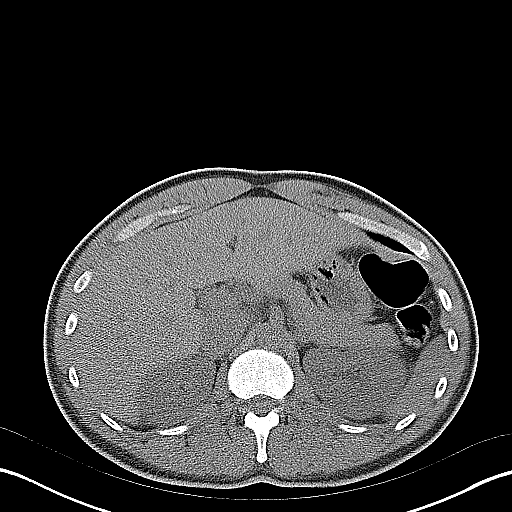
[im 2/24  bone]
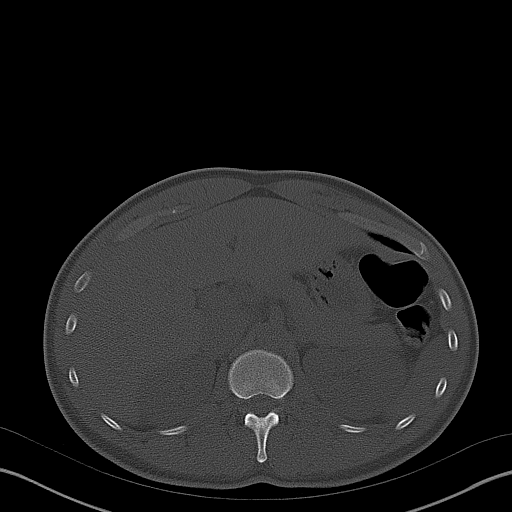
[im 4/24  soft-tissue]
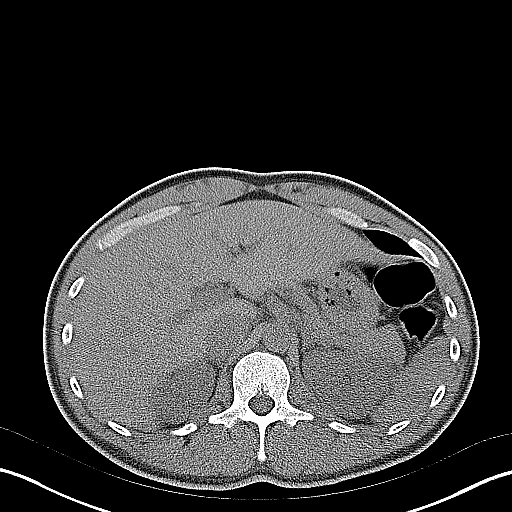
[im 6/24  soft-tissue]
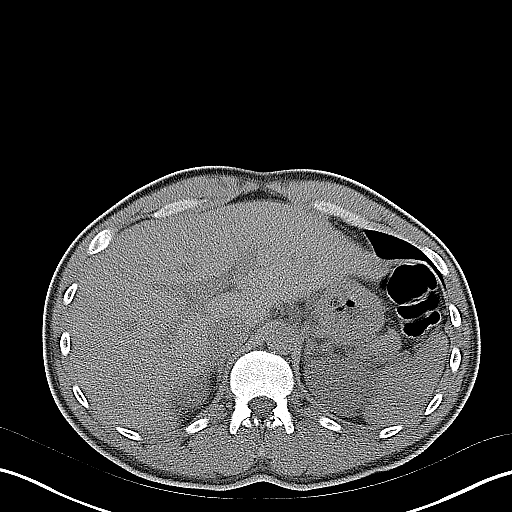
[im 7/24  soft-tissue]
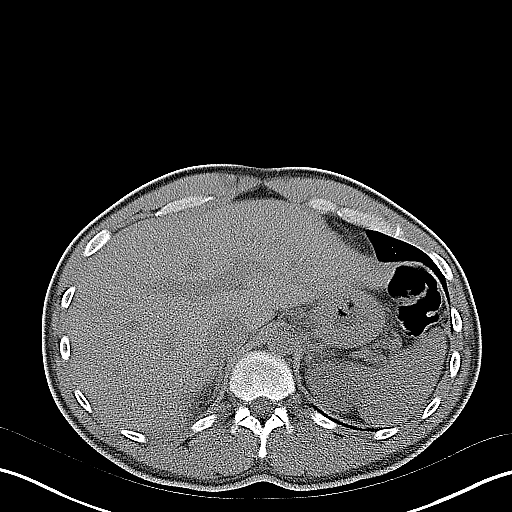
[im 9/24  soft-tissue]
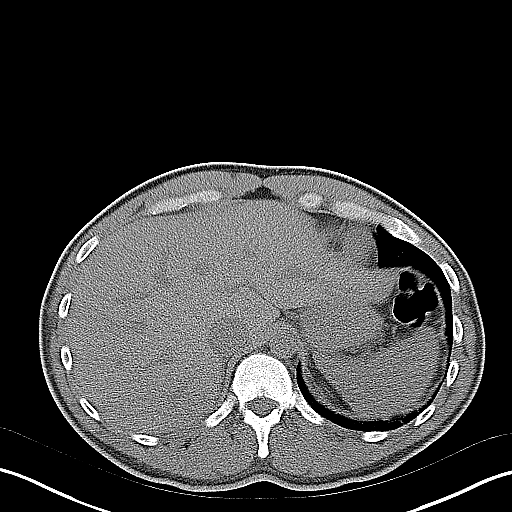
[im 11/24  soft-tissue]
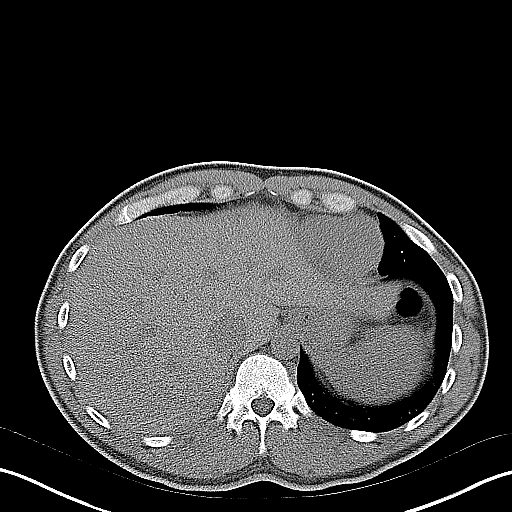
[im 13/24  soft-tissue]
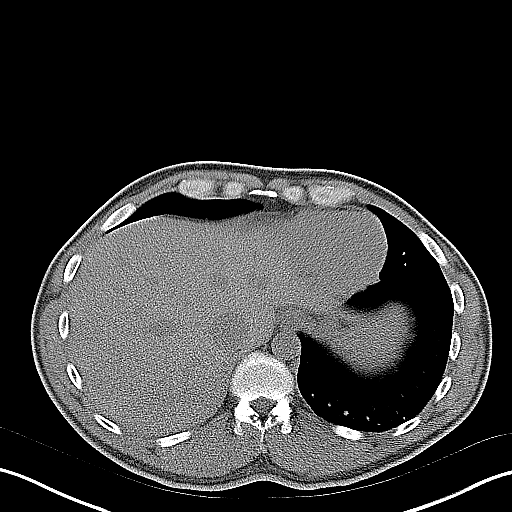
[im 14/24  soft-tissue]
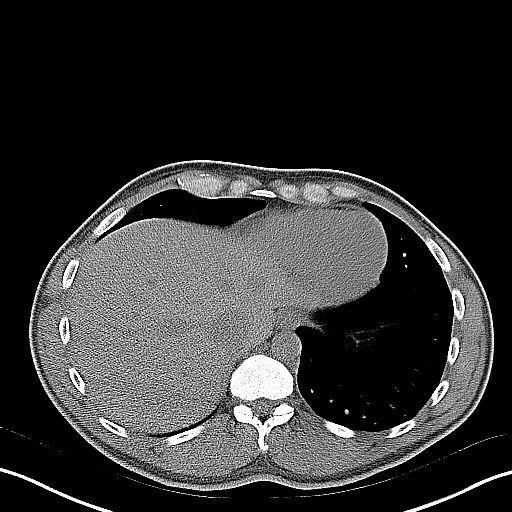
[im 16/24  soft-tissue]
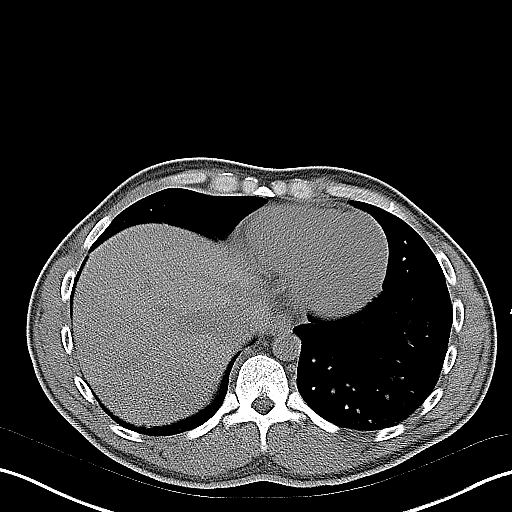
[im 16/24  bone]
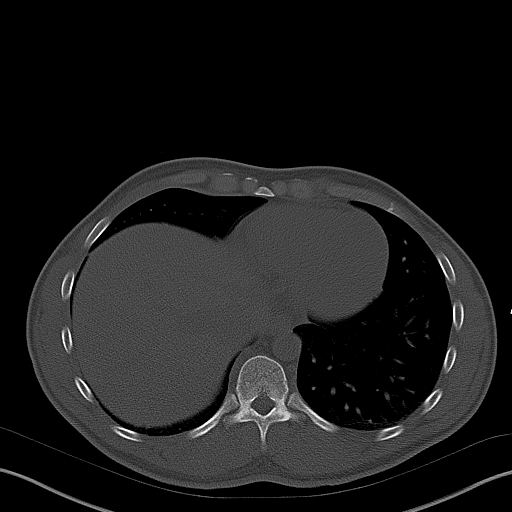
[im 18/24  soft-tissue]
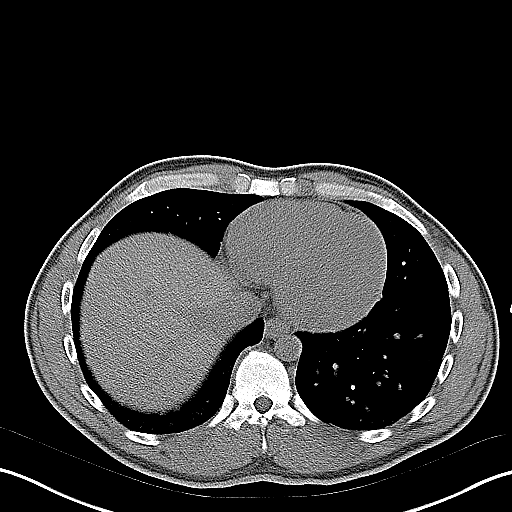
[im 19/24  soft-tissue]
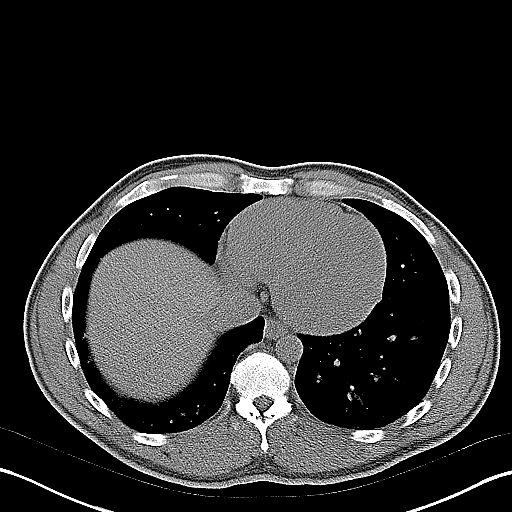
[im 20/24  lung]
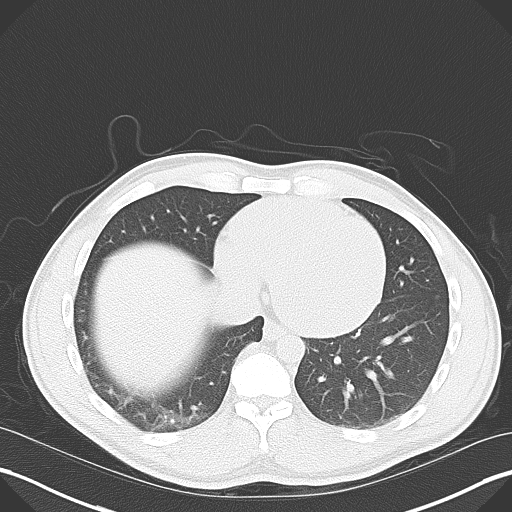
[im 21/24  soft-tissue]
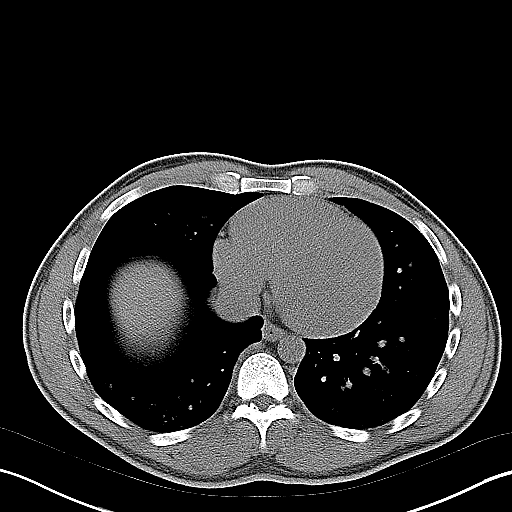
[im 21/24  lung]
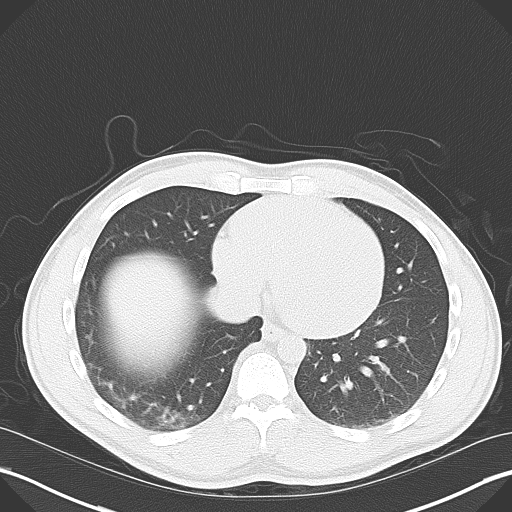
[im 22/24  lung]
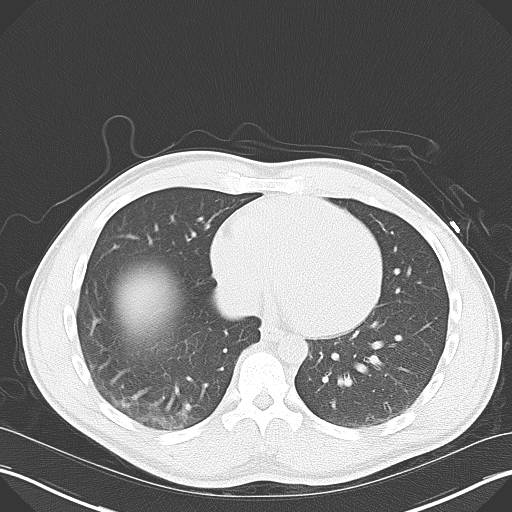
[im 23/24  soft-tissue]
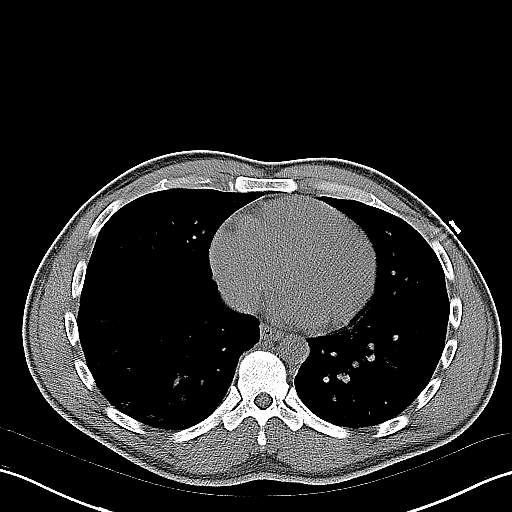
[im 23/24  lung]
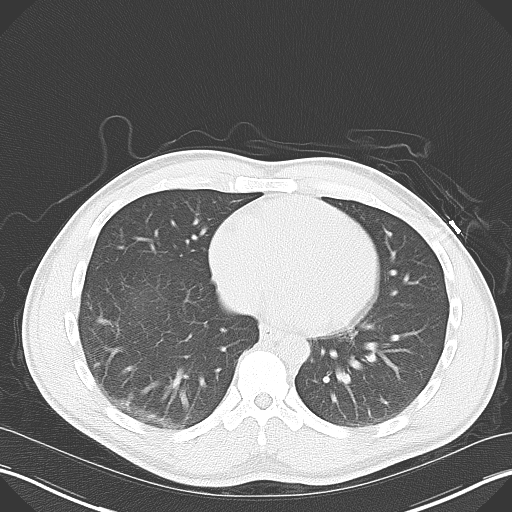

[15 of 24 positions shown; findings below may reference images not displayed]

FINDINGS: Dependent atelectasis in the lung bases.

There is a 3 mm stone in the distal left ureter at the level of the
ureterovesical junction. There is mild proximal ureterectasis and
pyelocaliectasis on the left. Mild periureteral stranding distally.
No renal or ureteral stones on the right. Bladder wall is not
thickened.

Unenhanced appearance of the liver, spleen, gallbladder, pancreas,
adrenal glands, abdominal aorta, inferior vena cava, and
retroperitoneal lymph nodes is unremarkable. Stomach, small bowel,
and colon are mostly decompressed. Scattered stool in the colon. No
free air or free fluid in the abdomen. Abdominal wall musculature
appears intact.

Pelvis: The appendix is normal. No free or loculated pelvic fluid
collections. Scattered calcified pelvic phleboliths. Calcification
in the as deferens. Prostate gland is not enlarged. No free or
loculated pelvic fluid collections. No findings to suggest
diverticulitis. No significant pelvic lymphadenopathy. Mild
degenerative changes in the lumbar spine. No destructive bone
lesions.
IMPRESSION: 3 mm stone in the distal left ureter with mild to moderate proximal
obstruction.

## 2016-01-21 IMAGING — CR DG FOREARM 2V*R*
2 series · 2 of 2 positions shown · non-contrast
Comparison: Right hand series of today's date.

CLINICAL DATA: Status post punching injury and subsequent fall 1
week ago ; right hand pain radiating into the forearm

EXAM:
RIGHT FOREARM - 2 VIEW

[x forearm ap right]
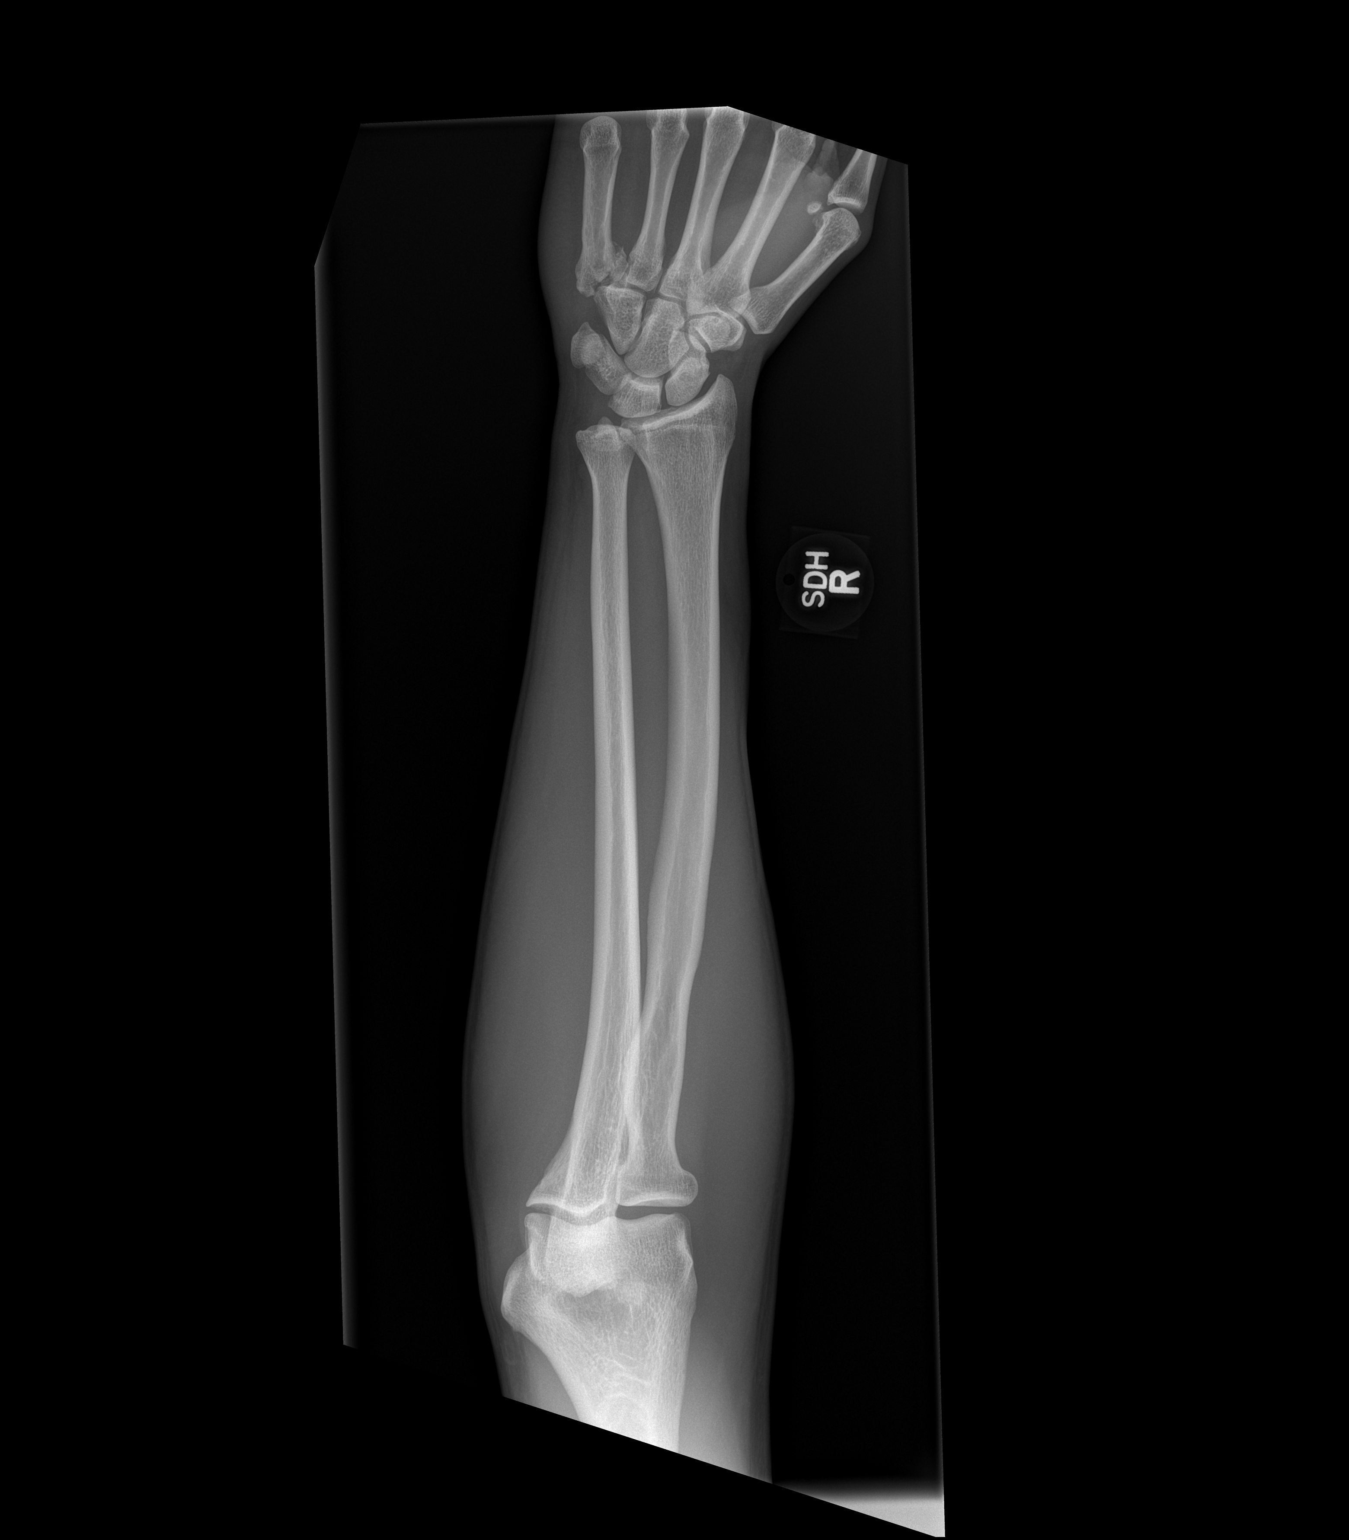

[x forearm lat right]
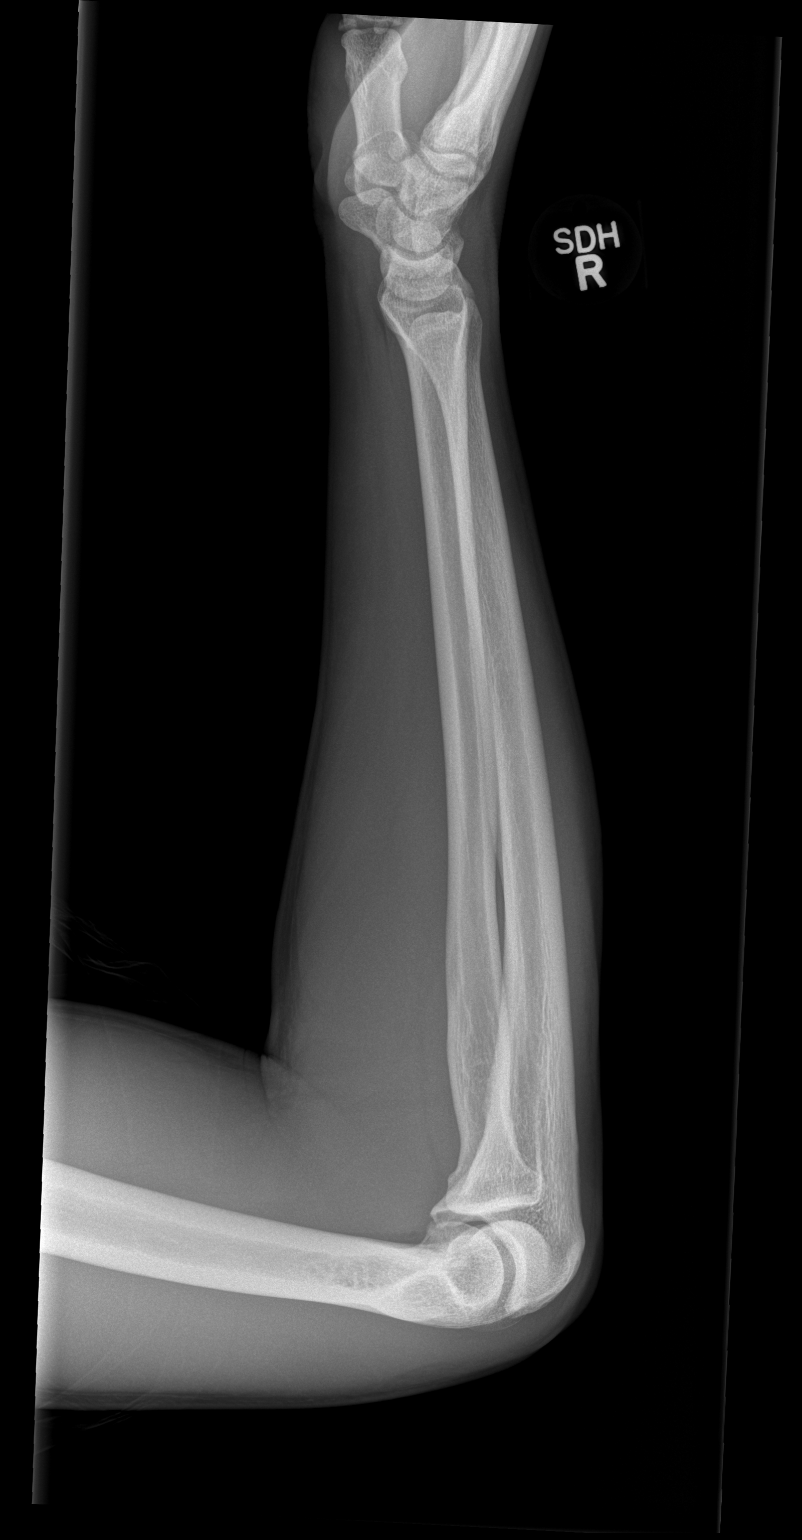

[2 of 2 positions shown; findings below may reference images not displayed]

FINDINGS: There is a known comminuted fracture of the base of the fifth
metacarpal. The carpal bones appear intact. The radius and ulna
exhibit no acute fracture nor dislocation. The soft tissues of the
forearm are unremarkable.
IMPRESSION: There is no acute bony abnormality of the right radius or ulna.

## 2017-11-02 ENCOUNTER — Encounter (HOSPITAL_BASED_OUTPATIENT_CLINIC_OR_DEPARTMENT_OTHER): Payer: Self-pay

## 2017-11-02 ENCOUNTER — Other Ambulatory Visit: Payer: Self-pay

## 2017-11-02 ENCOUNTER — Emergency Department (HOSPITAL_BASED_OUTPATIENT_CLINIC_OR_DEPARTMENT_OTHER)
Admission: EM | Admit: 2017-11-02 | Discharge: 2017-11-02 | Disposition: A | Discharge status: Home / Self Care | Payer: Self-pay | Attending: Emergency Medicine | Admitting: Emergency Medicine

## 2017-11-02 DIAGNOSIS — Z79899 Other long term (current) drug therapy: Secondary | ICD-10-CM | POA: Insufficient documentation

## 2017-11-02 DIAGNOSIS — L301 Dyshidrosis [pompholyx]: Secondary | ICD-10-CM | POA: Insufficient documentation

## 2017-11-02 DIAGNOSIS — R21 Rash and other nonspecific skin eruption: Secondary | ICD-10-CM

## 2017-11-02 HISTORY — DX: Dermatitis, unspecified: L30.9

## 2017-11-02 MED ORDER — TRIAMCINOLONE ACETONIDE 0.1 % EX CREA: 1.0000 "application " | TOPICAL_CREAM | Freq: Two times a day (BID) | CUTANEOUS | 0 refills | Status: AC

## 2017-11-02 NOTE — ED Notes (Signed)
Pt verbalizes understanding of d/c instructions and denies any further needs at this time. 

## 2017-11-02 NOTE — Discharge Instructions (Signed)
You were seen in the ER for rash.   This is consistent with eczema.   Use triamcinolone cream to hands twice daily.  Keep skin moisturized with ointment or creams.  Can take benadryl as needed for itching. Avoid fraganced lotions or moisturizers.   Return for signs of infection including redness, warmth, pus, fevers.

## 2017-11-02 NOTE — ED Triage Notes (Signed)
C/o scattered rash x 1 week-NAD-steady gait 

## 2017-11-03 NOTE — ED Provider Notes (Signed)
[facility] EMERGENCY DEPARTMENT Provider Note   CSN: [id number] Arrival date & time: 11/02/17  2204     History   Chief Complaint Chief Complaint  Patient presents with  . Rash    HPI Jeremy Dixon is a 42 y.o. male here for evaluation of rash to his hands.  Reports history of this for several years but acutely worsening 1 week ago.  The rash is dry, scaly, itchy and slightly red.  Rash is located mostly to his hands and in between his fingers spaces, intermittently does get rash to his ankles, thighs and forearms.  He has been told that this is eczema.  Has been able to use cortisone 10 cream and moisturizers but in the last week the stopped working.  He has recently been around his dog who has mange, he is washing and bathing his dog with his bare hands.  Additionally, he recently changed to a cheaper moisturizer.  There is any medications.  No lesions to the soles of feet.  No intraoral lesions.  Denies associated fevers, chills.  Associated symptoms including dry skin, itching, burning discomfort. No alleviating factors. Rash is most itchy after showers.   HPI  Past Medical History:  Diagnosis Date  . Eczema   . Kidney stone     There are no active problems to display for this patient.   History reviewed. No pertinent surgical history.      Home Medications    Prior to Admission medications   Medication Sig Start Date End Date Taking? Authorizing Provider  acetaminophen (TYLENOL) 500 MG tablet Take 1,000 mg by mouth every 6 (six) hours as needed for mild pain.    [provider]  naproxen (NAPROSYN) 500 MG tablet Take 1 tablet (500 mg total) by mouth 2 (two) times daily as needed for mild pain, moderate pain or headache (TAKE WITH MEALS.). Patient not taking: Reported on 02/03/2014 07/13/13   Street, [place], PA-C  ondansetron (ZOFRAN ODT) 4 MG disintegrating tablet 4mg  ODT q8 hours prn nausea/vomit Patient not taking: Reported on 02/03/2014 07/13/13    Street, [place], PA-C  oxyCODONE-acetaminophen (PERCOCET/ROXICET) 5-325 MG per tablet Take 2 tablets by mouth every 4 (four) hours as needed for severe pain. 02/03/14   Lorre Nick, MD  tamsulosin (FLOMAX) 0.4 MG CAPS capsule Take 1 capsule (0.4 mg total) by mouth daily after supper. Patient not taking: Reported on 02/03/2014 07/13/13   Street, [place], PA-C  triamcinolone cream (KENALOG) 0.1 % Apply 1 application topically 2 (two) times daily. 11/02/17   Liberty Handy, PA-C    Family History No family history on file.  Social History Social History   Tobacco Use  . Smoking status: Current Every Day Smoker    Packs/day: 1.00  . Smokeless tobacco: Never Used  Substance Use Topics  . Alcohol use: Not Currently  . Drug use: Not Currently    Frequency: 8.0 times per week     Allergies   Penicillins   Review of Systems Review of Systems  Skin: Positive for rash.  All other systems reviewed and are negative.    Physical Exam Updated Vital Signs BP (!) 152/110 (BP Location: Left Arm)   Pulse 97   Temp 98.6 F (37 C) (Oral)   Resp 20   SpO2 97%   Physical Exam  Constitutional: He is oriented to person, place, and time. He appears well-developed and well-nourished.  Non-toxic appearance.  HENT:  Head: Normocephalic.  Right Ear: External ear normal.  Left Ear: External ear normal.  Nose: Nose normal.  No intraoral lesions.   Eyes: Conjunctivae and EOM are normal.  Neck: Full passive range of motion without pain.  Cardiovascular: Normal rate.  Pulmonary/Chest: Effort normal. No tachypnea. No respiratory distress.  Musculoskeletal: Normal range of motion.  Neurological: He is alert and oriented to person, place, and time.  Skin: Skin is warm and dry. Capillary refill takes less than 2 seconds. Rash noted.  Multiple groups of light pink ruptured vesicles resembling "tapioca pudding" to palms of hands, web spaces and wrists.  Diffuse dry, fissured skin to bilateral  hands and web spaces.  No rash to soles. Pt is actively itching on exam.   Psychiatric: His behavior is normal. Thought content normal.     ED Treatments / Results  Labs (all labs ordered are listed, but only abnormal results are displayed) Labs Reviewed - No data to display  EKG None  Radiology No results found.  Procedures Procedures (including critical care time)  Medications Ordered in ED Medications - No data to display   Initial Impression / Assessment and Plan / ED Course  I have reviewed the triage vital signs and the nursing notes.  Pertinent labs & imaging results that were available during my care of the patient were reviewed by me and considered in my medical decision making (see chart for details).     Pt with long h/o eczema.  Exam most consistent with dyshidrotic eczema.  Noted recent irritants.  No signs of superimposed infections. Rash spares soles. No intraoral lesions. No recent new medications. Doubt dermatological emergency such as EM, SJS.  Syphilis rash considered less likely.  We will dc with triamcinolone cream.  No indication for systemic steroids today as rash is localized to hands only. OTC benadryl for itching, thick ointment/moisturizers encouraged.  Discussed return precautions. Recommended dermatology f/u for long term management/flares of eczema. Pt and friend in agreement and comfortable with plan.    Final Clinical Impressions(s) / ED Diagnoses   Final diagnoses:  Rash of hands    ED Discharge Orders         Ordered    triamcinolone cream (KENALOG) 0.1 %  2 times daily     11/02/17 2322           Liberty Handy, PA-C 11/03/17 Juventino Slovak, MD 11/03/17 [phone]

## 2021-01-15 ENCOUNTER — Other Ambulatory Visit: Payer: Self-pay

## 2021-01-15 ENCOUNTER — Encounter (HOSPITAL_COMMUNITY): Payer: Self-pay

## 2021-01-15 ENCOUNTER — Ambulatory Visit (HOSPITAL_COMMUNITY)
Admission: EM | Admit: 2021-01-15 | Discharge: 2021-01-15 | Disposition: A | Discharge status: Home / Self Care | Payer: Self-pay | Attending: Physician Assistant | Admitting: Physician Assistant

## 2021-01-15 DIAGNOSIS — Z113 Encounter for screening for infections with a predominantly sexual mode of transmission: Secondary | ICD-10-CM | POA: Insufficient documentation

## 2021-01-15 DIAGNOSIS — L282 Other prurigo: Secondary | ICD-10-CM | POA: Insufficient documentation

## 2021-01-15 DIAGNOSIS — R21 Rash and other nonspecific skin eruption: Secondary | ICD-10-CM | POA: Insufficient documentation

## 2021-01-15 LAB — HEPATITIS C ANTIBODY: HCV Ab: NONREACTIVE

## 2021-01-15 LAB — HIV ANTIBODY (ROUTINE TESTING W REFLEX): HIV Screen 4th Generation wRfx: NONREACTIVE

## 2021-01-15 MED ORDER — PERMETHRIN 5 % EX CREA: TOPICAL_CREAM | CUTANEOUS | 0 refills | Status: AC

## 2021-01-15 MED ORDER — VALACYCLOVIR HCL 500 MG PO TABS: 500.0000 mg | ORAL_TABLET | Freq: Two times a day (BID) | ORAL | 0 refills | Status: AC

## 2021-01-15 NOTE — ED Triage Notes (Signed)
Pt presents with c/o a rash on his arms, genital area X 2 days.   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   Pt requests STD screening.

## 2021-01-15 NOTE — Discharge Instructions (Signed)
I am not sure what is causing your rash.  I have called in Valtrex in case it is related to herpes.  Please take this as prescribed.  I am concerned that this may be related to scabies.  Please apply permethrin from your neck to your feet and sleep in this.  Make sure to wash your linens in high heat and vacuum the house at the same time to prevent reinfection.  He will wash off the permethrin after you have slept in it the next day by taking a shower.  Use Valtrex soaps and detergents.  If you have any worsening symptoms including spread of rash or additional symptoms you need to be seen immediately.  If symptoms not improving please return for reevaluation.  We are testing you for STIs.  We will contact you if anything is positive.  It is important that you abstain from sex until you receive your results.  If you are positive you will need to wait to have sex for 7 days after completing treatment.  It is important you use a condom with each sexual encounter.  If you develop any symptoms please return for reevaluation.

## 2021-01-15 NOTE — ED Provider Notes (Addendum)
[facility]    CSN: [id number] Arrival date & time: 01/15/21  1530      History   Chief Complaint Chief Complaint  Patient presents with   Rash   SEXUALLY TRANSMITTED DISEASE    HPI Jeremy Dixon is a 46 y.o. male.   Patient presents today with a 2-day history of widespread maculopapular rash.  He reports this is all located to his arms, interdigital region, abdomen, groin.  He reports intense pruritus.  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  He denies any history of dermatological condition or changes to medication.  Denies any recent antibiotic use.  Denies changes to personal hygiene products including soaps or detergents.  Denies any known sick contacts with similar symptoms or exposure to scabies.  He is interested in complete STI testing while here today.   Past Medical History:  Diagnosis Date   Eczema    Kidney stone     There are no problems to display for this patient.   History reviewed. No pertinent surgical history.     Home Medications    Prior to Admission medications   Medication Sig Start Date End Date Taking? Authorizing Provider  permethrin (ELIMITE) 5 % cream Apply from neck to feet and leave on for 8 to 14 hours before washing off in shower. 01/15/21  Yes Anastazja Isaac, Noberto RetortErin K, PA-C  valACYclovir (VALTREX) 500 MG tablet Take 1 tablet (500 mg total) by mouth 2 (two) times daily for 7 days. 01/15/21 01/22/21 Yes Tura Roller, Noberto RetortErin K, PA-C  acetaminophen (TYLENOL) 500 MG tablet Take 1,000 mg by mouth every 6 (six) hours as needed for mild pain.    [provider]  oxyCODONE-acetaminophen (PERCOCET/ROXICET) 5-325 MG per tablet Take 2 tablets by mouth every 4 (four) hours as needed for severe pain. 02/03/14   Lorre NickAllen, Anthony, MD  tamsulosin (FLOMAX) 0.4 MG CAPS capsule Take 1 capsule (0.4 mg total) by mouth daily after supper. Patient not taking: Reported  on 02/03/2014 07/13/13   Street, [place], PA-C  triamcinolone cream (KENALOG) 0.1 % Apply 1 application topically 2 (two) times daily. 11/02/17   Liberty HandyGibbons, Claudia J, PA-C    Family History History reviewed. No pertinent family history.  Social History Social History   Tobacco Use   Smoking status: Every Day    Packs/day: 1.00    Types: Cigarettes   Smokeless tobacco: Never  Substance Use Topics   Alcohol use: Not Currently   Drug use: Not Currently    Frequency: 8.0 times per week     Allergies   Penicillins   Review of Systems Review of Systems  Constitutional:  Negative for activity change, appetite change, fatigue and fever.  Respiratory:  Negative for cough and shortness of breath.   Cardiovascular:  Negative for chest pain.  Gastrointestinal:  Negative for abdominal pain, diarrhea, nausea and vomiting.  Genitourinary:  Negative for dysuria, frequency, testicular pain and urgency.  Skin:  Positive for rash.    Physical Exam Triage Vital Signs ED Triage Vitals  Enc Vitals Group     BP 01/15/21 1623 129/81     Pulse Rate 01/15/21 1622 68     Resp 01/15/21 1622 17     Temp 01/15/21 1622 98.6 F (37 C)     Temp Source 01/15/21 1622 Oral     SpO2 01/15/21 1622 99 %     Weight --  Height --      Head Circumference --      Peak Flow --      Pain Score 01/15/21 1621 0     Pain Loc --      Pain Edu? --      Excl. in [place]? --    No data found.  Updated Vital Signs BP 129/81 (BP Location: Left Arm)    Pulse 68    Temp 98.6 F (37 C) (Oral)    Resp 17    SpO2 99%   Visual Acuity Right Eye Distance:   Left Eye Distance:   Bilateral Distance:    Right Eye Near:   Left Eye Near:    Bilateral Near:     Physical Exam Vitals reviewed.  Constitutional:      General: He is awake.     Appearance: Normal appearance. He is well-developed. He is not ill-appearing.     Comments: Very pleasant male appears stated age in no acute distress sitting comfortably in  exam room  HENT:     Head: Normocephalic and atraumatic.     Mouth/Throat:     Pharynx: No oropharyngeal exudate, posterior oropharyngeal erythema or uvula swelling.  Cardiovascular:     Rate and Rhythm: Normal rate and regular rhythm.     Heart sounds: Normal heart sounds, S1 normal and S2 normal. No murmur heard. Pulmonary:     Effort: Pulmonary effort is normal.     Breath sounds: Normal breath sounds. No stridor. No wheezing, rhonchi or rales.     Comments: Clear to auscultation bilaterally Abdominal:     General: Bowel sounds are normal.     Palpations: Abdomen is soft.     Tenderness: There is no abdominal tenderness.     Comments: Benign abdominal exam  Genitourinary:    Comments: Exam deferred Skin:    Findings: Rash present. Rash is macular and papular.     Comments: Maculopapular rash with burrows and interdigital spaces noted on forearms and abdomen.  No vesicles noted.  Neurological:     Mental Status: He is alert.  Psychiatric:        Behavior: Behavior is cooperative.     UC Treatments / Results  Labs (all labs ordered are listed, but only abnormal results are displayed) Labs Reviewed  HIV ANTIBODY (ROUTINE TESTING W REFLEX)  HEPATITIS C ANTIBODY  RPR  CYTOLOGY, (ORAL, ANAL, URETHRAL) ANCILLARY ONLY    EKG   Radiology No results found.  Procedures Procedures (including critical care time)  Medications Ordered in UC Medications - No data to display  Initial Impression / Assessment and Plan / UC Course  I have reviewed the triage vital signs and the nursing notes.  Pertinent labs & imaging results that were available during my care of the patient were reviewed by me and considered in my medical decision making (see chart for details).     Unclear etiology of rash.  Patient reports history of similar symptoms that have responded to Valtrex due to history of herpes.  Discussed that clinical presentation is not consistent with herpes and given  burrows in interdigital spaces concern for scabies the patient denies any known exposure or concerns for exposure.  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  Denies any significant repeated water exposure.  We will treat with Valtrex given he has successfully manage symptoms with medication in the past.  We will also  cover with permethrin for scabies patient was instructed to apply medication and then washes off the next day in the shower.  Discussed the importance of washing everything in high heat and cleaning the house at the same time he is treating himself case it is scabies.  If symptoms not improving will consider topical steroids to treat dyshidrotic eczema.  Discussed that if symptoms persist or worsen in any way he needs to be reevaluated.  Discussed alarm symptoms that warrant going to the emergency room.  STI testing obtained today-results pending.  We will contact patient if we need to arrange any treatment based on laboratory results.  Final Clinical Impressions(s) / UC Diagnoses   Final diagnoses:  Rash and nonspecific skin eruption  Pruritic rash  Routine screening for STI (sexually transmitted infection)     Discharge Instructions      I am not sure what is causing your rash.  I have called in Valtrex in case it is related to herpes.  Please take this as prescribed.  I am concerned that this may be related to scabies.  Please apply permethrin from your neck to your feet and sleep in this.  Make sure to wash your linens in high heat and vacuum the house at the same time to prevent reinfection.  He will wash off the permethrin after you have slept in it the next day by taking a shower.  Use Valtrex soaps and detergents.  If you have any worsening symptoms including spread of rash or additional symptoms you need to be seen immediately.  If symptoms not improving please return for reevaluation.  We are  testing you for STIs.  We will contact you if anything is positive.  It is important that you abstain from sex until you receive your results.  If you are positive you will need to wait to have sex for 7 days after completing treatment.  It is important you use a condom with each sexual encounter.  If you develop any symptoms please return for reevaluation.     ED Prescriptions     Medication Sig Dispense Auth. Provider   valACYclovir (VALTREX) 500 MG tablet Take 1 tablet (500 mg total) by mouth 2 (two) times daily for 7 days. 14 tablet Ceira Hoeschen K, PA-C   permethrin (ELIMITE) 5 % cream Apply from neck to feet and leave on for 8 to 14 hours before washing off in shower. 60 g Kajuan Guyton, Noberto Retort, PA-C      PDMP not reviewed this encounter.   Jeani Hawking, PA-C 01/15/21 1715    Mattia Liford, Noberto Retort, PA-C 01/15/21 1717

## 2021-01-16 LAB — CYTOLOGY, (ORAL, ANAL, URETHRAL) ANCILLARY ONLY
Chlamydia: NEGATIVE
Comment: NEGATIVE
Comment: NEGATIVE
Comment: NORMAL
Neisseria Gonorrhea: NEGATIVE
Trichomonas: NEGATIVE

## 2021-01-16 LAB — RPR: RPR Ser Ql: NONREACTIVE

## 2021-02-03 ENCOUNTER — Other Ambulatory Visit: Payer: Self-pay

## 2021-02-03 ENCOUNTER — Ambulatory Visit (HOSPITAL_COMMUNITY)
Admission: EM | Admit: 2021-02-03 | Discharge: 2021-02-03 | Disposition: A | Discharge status: Home / Self Care | Payer: Self-pay | Attending: Physician Assistant | Admitting: Physician Assistant

## 2021-02-03 ENCOUNTER — Encounter (HOSPITAL_COMMUNITY): Payer: Self-pay

## 2021-02-03 DIAGNOSIS — R21 Rash and other nonspecific skin eruption: Secondary | ICD-10-CM

## 2021-02-03 MED ORDER — HYDROCORTISONE 1 % EX OINT: 1.0000 "application " | TOPICAL_OINTMENT | Freq: Two times a day (BID) | CUTANEOUS | 0 refills | Status: AC

## 2021-02-03 MED ORDER — VALACYCLOVIR HCL 500 MG PO TABS: 500.0000 mg | ORAL_TABLET | Freq: Two times a day (BID) | ORAL | 0 refills | Status: AC

## 2021-02-03 MED ORDER — PREDNISONE 10 MG (21) PO TBPK: ORAL_TABLET | Freq: Every day | ORAL | 0 refills | Status: DC

## 2021-02-03 NOTE — ED Provider Notes (Addendum)
[facility]    CSN: [id number] Arrival date & time: 02/03/21  1003      History   Chief Complaint Chief Complaint  Patient presents with   Rash    HPI Jeremy Dixon is a 46 y.o. male.   Pt presents for follow up regarding persistent rash.  Seen in clinic 01/15/21 with diffuse itching maculopapular rash to hands and arms.  Prescribed permetharin and valacyclovir with no improvement. STI testing negative.  He has has tried no other lotions or creams.  Reports rash is now noted to legs as well.  He also reports a lesion to his penis, he reports this was helping when he was taking valacyclovir. He has a h/o eczema, seen in the past.  He has never seen a dermatologist.   Past Medical History:  Diagnosis Date   Eczema    Kidney stone     There are no problems to display for this patient.   No past surgical history on file.     Home Medications    Prior to Admission medications   Medication Sig Start Date End Date Taking? Authorizing Provider  hydrocortisone 1 % ointment Apply 1 application topically 2 (two) times daily. 02/03/21  Yes Ward, Lenise Arena, PA-C  valACYclovir (VALTREX) 500 MG tablet Take 1 tablet (500 mg total) by mouth 2 (two) times daily for 7 days. 02/03/21 02/10/21 Yes Ward, Lenise Arena, PA-C  acetaminophen (TYLENOL) 500 MG tablet Take 1,000 mg by mouth every 6 (six) hours as needed for mild pain.    [provider]  oxyCODONE-acetaminophen (PERCOCET/ROXICET) 5-325 MG per tablet Take 2 tablets by mouth every 4 (four) hours as needed for severe pain. 02/03/14   Lacretia Leigh, MD  permethrin (ELIMITE) 5 % cream Apply from neck to feet and leave on for 8 to 14 hours before washing off in shower. 01/15/21   Raspet, Derry Skill, PA-C  tamsulosin (FLOMAX) 0.4 MG CAPS capsule Take 1 capsule (0.4 mg total) by mouth daily after supper. Patient not taking: Reported on 02/03/2014 07/13/13   Street, [place], PA-C  triamcinolone cream (KENALOG) 0.1 % Apply 1  application topically 2 (two) times daily. 11/02/17   Kinnie Feil, PA-C    Family History No family history on file.  Social History Social History   Tobacco Use   Smoking status: Every Day    Packs/day: 1.00    Types: Cigarettes   Smokeless tobacco: Never  Substance Use Topics   Alcohol use: Not Currently   Drug use: Not Currently    Frequency: 8.0 times per week     Allergies   Penicillins   Review of Systems Review of Systems  Constitutional:  Negative for chills and fever.  HENT:  Negative for ear pain and sore throat.   Eyes:  Negative for pain and visual disturbance.  Respiratory:  Negative for cough and shortness of breath.   Cardiovascular:  Negative for chest pain and palpitations.  Gastrointestinal:  Negative for abdominal pain and vomiting.  Genitourinary:  Negative for dysuria and hematuria.  Musculoskeletal:  Negative for arthralgias and back pain.  Skin:  Positive for rash. Negative for color change.  Neurological:  Negative for seizures and syncope.  All other systems reviewed and are negative.   Physical Exam Triage Vital Signs ED Triage Vitals  Enc Vitals Group     BP 02/03/21 1013 121/78     Pulse Rate 02/03/21 1014 78     Resp 02/03/21 1013 16  Temp 02/03/21 1014 98.1 F (36.7 C)     Temp Source 02/03/21 1014 Oral     SpO2 02/03/21 1013 100 %     Weight --      Height --      Head Circumference --      Peak Flow --      Pain Score 02/03/21 1014 0     Pain Loc --      Pain Edu? --      Excl. in [place]? --    No data found.  Updated Vital Signs BP 121/78 (BP Location: Left Arm)    Pulse 78    Temp 98.1 F (36.7 C) (Oral)    Resp 16    SpO2 100%   Visual Acuity Right Eye Distance:   Left Eye Distance:   Bilateral Distance:    Right Eye Near:   Left Eye Near:    Bilateral Near:     Physical Exam Vitals and nursing note reviewed.  Constitutional:      General: He is not in acute distress.    Appearance: He is  well-developed.  HENT:     Head: Normocephalic and atraumatic.  Eyes:     Conjunctiva/sclera: Conjunctivae normal.  Cardiovascular:     Rate and Rhythm: Normal rate and regular rhythm.     Heart sounds: No murmur heard. Pulmonary:     Effort: Pulmonary effort is normal. No respiratory distress.     Breath sounds: Normal breath sounds.  Abdominal:     Palpations: Abdomen is soft.     Tenderness: There is no abdominal tenderness.  Musculoskeletal:        General: No swelling.     Cervical back: Neck supple.  Skin:    General: Skin is warm and dry.     Capillary Refill: Capillary refill takes less than 2 seconds.     Comments: Maculopapular rash to arms, hands, patches of scaling skin noted as well.  Neurological:     Mental Status: He is alert.  Psychiatric:        Mood and Affect: Mood normal.     UC Treatments / Results  Labs (all labs ordered are listed, but only abnormal results are displayed) Labs Reviewed  HSV CULTURE AND TYPING    EKG   Radiology No results found.  Procedures Procedures (including critical care time)  Medications Ordered in UC Medications - No data to display  Initial Impression / Assessment and Plan / UC Course  I have reviewed the triage vital signs and the nursing notes.  Pertinent labs & imaging results that were available during my care of the patient were reviewed by me and considered in my medical decision making (see chart for details).     HSV culture done of penile lesion today.  Will refill valacyclovir.  Topical steroid cream prescribed for lesions on arms and hands.  Advised follow up for eczema type rash with dermatology.  Advised to abstain from sexual intercourse until HSV culture is back and penile lesion has resolved.   Final Clinical Impressions(s) / UC Diagnoses   Final diagnoses:  Rash     Discharge Instructions      Take medication as prescribed Recommend emollient lotions like cerave or aquaphor to arms  and hands  HSV culture pending.  Abstain from sexual intercourse until lesion has resolved.  Follow up with dermatology for eczema like rash if no improvement.      ED Prescriptions  Medication Sig Dispense Auth. Provider   predniSONE (STERAPRED UNI-PAK 21 TAB) 10 MG (21) TBPK tablet  (Status: Discontinued) Take by mouth daily. Take 6 tabs by mouth daily  for 2 days, then 5 tabs for 2 days, then 4 tabs for 2 days, then 3 tabs for 2 days, 2 tabs for 2 days, then 1 tab by mouth daily for 2 days 42 tablet Ward, Lenise Arena, PA-C   valACYclovir (VALTREX) 500 MG tablet Take 1 tablet (500 mg total) by mouth 2 (two) times daily for 7 days. 14 tablet Ward, Lenise Arena, PA-C   hydrocortisone 1 % ointment Apply 1 application topically 2 (two) times daily. 30 g Ward, Lenise Arena, PA-C      PDMP not reviewed this encounter.   Ward, Lenise Arena, PA-C 02/03/21 1037    Ward, Lenise Arena, PA-C 02/03/21 1052

## 2021-02-03 NOTE — ED Triage Notes (Signed)
Pt presents for rash on hands and arms for several days.

## 2021-02-03 NOTE — Discharge Instructions (Addendum)
Take medication as prescribed Recommend emollient lotions like cerave or aquaphor to arms and hands  HSV culture pending.  Abstain from sexual intercourse until lesion has resolved.  Follow up with dermatology for eczema like rash if no improvement.

## 2021-02-06 LAB — HSV CULTURE AND TYPING

## 2021-05-07 ENCOUNTER — Telehealth: Payer: Self-pay | Admitting: Family Medicine

## 2021-05-07 DIAGNOSIS — R21 Rash and other nonspecific skin eruption: Secondary | ICD-10-CM

## 2021-05-07 NOTE — Progress Notes (Signed)
[place] ? ?Needs in person follow up and need for on going treatment needs PCP referral ? ?Message sent on EV  ?

## 2021-05-09 ENCOUNTER — Encounter (HOSPITAL_COMMUNITY): Payer: Self-pay

## 2021-05-09 ENCOUNTER — Ambulatory Visit (HOSPITAL_COMMUNITY)
Admission: EM | Admit: 2021-05-09 | Discharge: 2021-05-09 | Disposition: A | Discharge status: Home / Self Care | Payer: Self-pay

## 2021-05-09 DIAGNOSIS — R6883 Chills (without fever): Secondary | ICD-10-CM

## 2021-05-09 DIAGNOSIS — B029 Zoster without complications: Secondary | ICD-10-CM

## 2021-05-09 MED ORDER — ACYCLOVIR 400 MG PO TABS
ORAL_TABLET | ORAL | 0 refills | Status: AC
Start: 2021-05-09 — End: 2021-07-13

## 2021-05-09 NOTE — Discharge Instructions (Addendum)
You were seen in urgent care today for your herpes outbreak. ?Take 800 mg twice daily for 5 days (2 pills at breakfast and 2 pills at dinner) to treat the current infection. ?THEN, take 400mg  twice daily for maintenance therapy. (1 pill at breakfast and 1 pill at dinner) ? ?I have given you a 2 month supply so that you can have time to establish care with a primary care provider to refill this medication for you and check in with you on a more frequent basis. ? ?If you develop any new or worsening symptoms or do not improve in the next 2 to 3 days, please return.  If your symptoms are severe, please go to the emergency room.  Follow-up with your primary care provider for further evaluation and management of your symptoms as well as ongoing wellness visits.  I hope you feel better! ? ?

## 2021-05-09 NOTE — ED Provider Notes (Addendum)
?[facility] ? ? ? ?CSN: [id number] ?Arrival date & time: 05/09/21  0813 ? ? ?  ? ?History   ?Chief Complaint ?Chief Complaint  ?Patient presents with  ? Medication Refill  ? ? ?HPI ?Jeremy Dixon is a 46 y.o. male.  ? ?Patient presents to urgent care for medication refill for his valtrex because he has a "breakout". Denies penile discharge. He came here to be tested recently and found out that he has Herpes in January. He got valtrex back then, but he ran out. He states that he has had chills at home for the last 2-3 days since the "breakout" rash to his genital area started. States the rash is warm to the touch and very painful with any type of "friction".  The rash is not draining and he has not noticed any pus or swelling.  He states that this rash is the same as the rash in January when he was diagnosed with herpes.  Denies headache, nausea, vomiting, and difficulty urinating.  He also denies nasal congestion, shortness of breath, chest pain, and body aches.  He has had mild abdominal pain that is intermittent and started yesterday. Denies taking medication for chills/abdominal pain. Denies recent new sexual partners and declines testing for STIs today. He recently "switched jobs and doesn't have insurance right now" and does not have a primary care provider.  Plan to set patient up with primary care provider with PCP assistance today. ? ? ?Medication Refill ? ?Past Medical History:  ?Diagnosis Date  ? Eczema   ? Kidney stone   ? ? ?There are no problems to display for this patient. ? ? ?History reviewed. No pertinent surgical history. ? ? ? ? ?Home Medications   ? ?Prior to Admission medications   ?Medication Sig Start Date End Date Taking? Authorizing Provider  ?acyclovir (ZOVIRAX) 400 MG tablet Take 2 tablets (800 mg total) by mouth 3 (three) times daily for 5 days, THEN 1 tablet (400 mg total) 2 (two) times daily. 05/09/21 07/13/21 Yes Carlisle BeersStanhope, Nelvin Tomb M, FNP  ?valACYclovir (VALTREX) 500 MG tablet  Take 500 mg by mouth 2 (two) times daily.   Yes [provider]  ?acetaminophen (TYLENOL) 500 MG tablet Take 1,000 mg by mouth every 6 (six) hours as needed for mild pain.    [provider]  ?hydrocortisone 1 % ointment Apply 1 application topically 2 (two) times daily. 02/03/21   Ward, Tylene FantasiaJessica Z, PA-C  ?oxyCODONE-acetaminophen (PERCOCET/ROXICET) 5-325 MG per tablet Take 2 tablets by mouth every 4 (four) hours as needed for severe pain. 02/03/14   Lorre NickAllen, Anthony, MD  ?permethrin (ELIMITE) 5 % cream Apply from neck to feet and leave on for 8 to 14 hours before washing off in shower. 01/15/21   Raspet, Noberto RetortErin K, PA-C  ?tamsulosin (FLOMAX) 0.4 MG CAPS capsule Take 1 capsule (0.4 mg total) by mouth daily after supper. ?Patient not taking: Reported on 02/03/2014 07/13/13   Street, Pondera ColonyMercedes, PA-C  ?triamcinolone cream (KENALOG) 0.1 % Apply 1 application topically 2 (two) times daily. 11/02/17   Liberty HandyGibbons, Claudia J, PA-C  ? ? ?Family History ?Family History  ?Family history unknown: Yes  ? ? ?Social History ?Social History  ? ?Tobacco Use  ? Smoking status: Every Day  ?  Packs/day: 1.00  ?  Types: Cigarettes  ? Smokeless tobacco: Never  ?Substance Use Topics  ? Alcohol use: Not Currently  ? Drug use: Not Currently  ?  Frequency: 8.0 times per week  ? ? ? ?  Allergies   ?Penicillins ? ? ?Review of Systems ?Review of Systems ?Per HPI ? ?Physical Exam ?Triage Vital Signs ?ED Triage Vitals  ?Enc Vitals Group  ?   BP 05/09/21 0843 122/77  ?   Pulse Rate 05/09/21 0843 (!) 57  ?   Resp 05/09/21 0843 18  ?   Temp 05/09/21 0843 97.8 ?F (36.6 ?C)  ?   Temp Source 05/09/21 0843 Oral  ?   SpO2 05/09/21 0843 96 %  ?   Weight --   ?   Height --   ?   Head Circumference --   ?   Peak Flow --   ?   Pain Score 05/09/21 0842 0  ?   Pain Loc --   ?   Pain Edu? --   ?   Excl. in GC? --   ? ?No data found. ? ?Updated Vital Signs ?BP 122/77 (BP Location: Right Arm)   Pulse (!) 57   Temp 97.8 ?F (36.6 ?C) (Oral)   Resp 18   SpO2 96%   ? ?Visual Acuity ?Right Eye Distance:   ?Left Eye Distance:   ?Bilateral Distance:   ? ?Right Eye Near:   ?Left Eye Near:    ?Bilateral Near:    ? ?Physical Exam ?Vitals and nursing note reviewed.  ?Constitutional:   ?   General: He is not in acute distress. ?   Appearance: Normal appearance. He is well-developed. He is not ill-appearing.  ?   Comments: Very pleasant patient sitting comfortably on exam able in no acute distress.   ?HENT:  ?   Head: Normocephalic and atraumatic.  ?   Right Ear: Tympanic membrane, ear canal and external ear normal.  ?   Left Ear: Tympanic membrane, ear canal and external ear normal.  ?   Nose: Nose normal.  ?   Mouth/Throat:  ?   Mouth: Mucous membranes are moist.  ?   Pharynx: No posterior oropharyngeal erythema.  ?Eyes:  ?   General: Lids are normal. Vision grossly intact. Gaze aligned appropriately.  ?   Extraocular Movements: Extraocular movements intact.  ?   Conjunctiva/sclera: Conjunctivae normal.  ?   Right eye: Right conjunctiva is not injected.  ?   Left eye: Left conjunctiva is not injected.  ?Cardiovascular:  ?   Rate and Rhythm: Normal rate and regular rhythm.  ?   Pulses: Normal pulses.  ?   Heart sounds: Normal heart sounds, S1 normal and S2 normal. No murmur heard. ?  No friction rub. No gallop.  ?Pulmonary:  ?   Effort: Pulmonary effort is normal. No respiratory distress.  ?   Breath sounds: Normal breath sounds. No decreased air movement.  ?Abdominal:  ?   General: There is no distension.  ?   Palpations: Abdomen is soft.  ?   Tenderness: There is no abdominal tenderness. There is no right CVA tenderness or left CVA tenderness.  ?Musculoskeletal:     ?   General: No swelling.  ?   Cervical back: Normal range of motion and neck supple.  ?   Right lower leg: No edema.  ?   Left lower leg: No edema.  ?Lymphadenopathy:  ?   Cervical: No cervical adenopathy.  ?Skin: ?   General: Skin is warm and dry.  ?   Capillary Refill: Capillary refill takes less than 2 seconds.  ?    Findings: No rash.  ?Neurological:  ?   General: No focal deficit present.  ?  Mental Status: He is alert and oriented to person, place, and time. Mental status is at baseline.  ?   Cranial Nerves: No cranial nerve deficit.  ?   Sensory: No sensory deficit.  ?   Motor: No weakness.  ?   Coordination: Coordination normal.  ?   Gait: Gait is intact. Gait normal.  ?Psychiatric:     ?   Attention and Perception: Attention and perception normal.     ?   Mood and Affect: Mood normal.     ?   Speech: Speech normal.     ?   Behavior: Behavior normal. Behavior is cooperative.     ?   Thought Content: Thought content normal.     ?   Cognition and Memory: Cognition and memory normal.     ?   Judgment: Judgment normal.  ? ? ? ?UC Treatments / Results  ?Labs ?(all labs ordered are listed, but only abnormal results are displayed) ?Labs Reviewed - No data to display ? ?EKG ? ? ?Radiology ?No results found. ? ?Procedures ?Procedures (including critical care time) ? ?Medications Ordered in UC ?Medications - No data to display ? ?Initial Impression / Assessment and Plan / UC Course  ?I have reviewed the triage vital signs and the nursing notes. ? ?Pertinent labs & imaging results that were available during my care of the patient were reviewed by me and considered in my medical decision making (see chart for details). ? ?Patient is a 46 year old male here today for Valtrex refill.  He reports chills at home, but has not taken his temperature at home.  He is afebrile in the office today.  He declines a genital exam today.  States that this rash to his genitals is the same as it was in January 2023 when he was tested for STIs and diagnosed with herpes infection.  Cardiopulmonary exam is stable.  No rash to any other area of his body.  Abdominal exam stable and patient is nontender to light and deep palpation of all 4 quadrants.  Patient may take Tylenol and ibuprofen at home for chills.  PCP assistance initiated today to follow-up on  herpes outbreak. ? ?He has taken valacyclovir in the past for his herpes infection, but he does not insurance at this time and acyclovir is significantly cheaper with a good Rx prescription coupon.  Plan

## 2021-05-09 NOTE — ED Triage Notes (Signed)
Pt presents for medication refill of valtrex. 

## 2021-05-10 ENCOUNTER — Encounter (HOSPITAL_COMMUNITY): Payer: Self-pay

## 2023-09-17 ENCOUNTER — Ambulatory Visit: Payer: Self-pay | Admitting: Nurse Practitioner

## 2023-10-09 ENCOUNTER — Ambulatory Visit: Admitting: Nurse Practitioner

## 2023-11-06 ENCOUNTER — Ambulatory Visit
Admission: EM | Admit: 2023-11-06 | Discharge: 2023-11-06 | Disposition: A | Discharge status: Home / Self Care | Attending: Emergency Medicine | Admitting: Emergency Medicine

## 2023-11-06 DIAGNOSIS — Z113 Encounter for screening for infections with a predominantly sexual mode of transmission: Secondary | ICD-10-CM

## 2023-11-06 DIAGNOSIS — Z202 Contact with and (suspected) exposure to infections with a predominantly sexual mode of transmission: Secondary | ICD-10-CM | POA: Diagnosis present

## 2023-11-06 DIAGNOSIS — R350 Frequency of micturition: Secondary | ICD-10-CM | POA: Diagnosis present

## 2023-11-06 LAB — POCT URINE DIPSTICK
Bilirubin, UA: NEGATIVE
Blood, UA: NEGATIVE
Glucose, UA: NEGATIVE mg/dL
Leukocytes, UA: NEGATIVE
Nitrite, UA: NEGATIVE
Protein Ur, POC: 100 mg/dL — AB
Spec Grav, UA: >=1.03 — AB (ref 1.010–1.025)
Urobilinogen, UA: 4 U/dL — AB
pH, UA: 5.5 (ref 5.0–8.0)

## 2023-11-06 MED ORDER — METRONIDAZOLE 500 MG PO TABS: 500.0000 mg | ORAL_TABLET | Freq: Two times a day (BID) | ORAL | 0 refills | Status: AC

## 2023-11-06 NOTE — Discharge Instructions (Addendum)
 Your tests are pending.  Your test results will be available on your MyChart account.  If your test results are positive, we will call you.  You and your sexual partner(s) may require treatment at that time.  Do not have sexual activity until all test results are back and treatment is completed if needed.

## 2023-11-06 NOTE — ED Triage Notes (Signed)
 Patient to Urgent Care with complaints of stomach upset, describes feeling like it's in knot. Cloudy and frequent urination.  Also requests STD testing after being exposed to trichomoniasis.   Symptoms x2 days.

## 2023-11-06 NOTE — ED Provider Notes (Signed)
 Jeremy Dixon: [id number] Arrival date & time: 11/06/23  1719      History   Chief Complaint Chief Complaint  Patient presents with   Exposure to STD   Chills   Urinary Frequency    HPI Jeremy Dixon is a 48 y.o. male.  Patient presents with cloudy urine and urinary frequency x 2 days.  He states his girlfriend notified him that she had trichomonas.  He denies penile discharge, testicular pain, fever, chills, dysuria, hematuria, flank pain, rash, lesions.  The history is provided by the patient and medical records.    Past Medical History:  Diagnosis Date   Eczema    Kidney stone     There are no active problems to display for this patient.   History reviewed. No pertinent surgical history.     Home Medications    Prior to Admission medications   Medication Sig Start Date End Date Taking? Authorizing Provider  metroNIDAZOLE (FLAGYL) 500 MG tablet Take 1 tablet (500 mg total) by mouth 2 (two) times daily. 11/06/23  Yes Corlis Burnard DEL, NP  acetaminophen  (TYLENOL ) 500 MG tablet Take 1,000 mg by mouth every 6 (six) hours as needed for mild pain.    [provider]  hydrocortisone  1 % ointment Apply 1 application topically 2 (two) times daily. Patient not taking: Reported on 11/06/2023 02/03/21   Ward, Jessica Z, PA-C  oxyCODONE -acetaminophen  (PERCOCET/ROXICET) 5-325 MG per tablet Take 2 tablets by mouth every 4 (four) hours as needed for severe pain. Patient not taking: Reported on 11/06/2023 02/03/14   Dasie Faden, MD  permethrin  (ELIMITE ) 5 % cream Apply from neck to feet and leave on for 8 to 14 hours before washing off in shower. Patient not taking: Reported on 11/06/2023 01/15/21   Raspet, Erin K, PA-C  tamsulosin  (FLOMAX ) 0.4 MG CAPS capsule Take 1 capsule (0.4 mg total) by mouth daily after supper. Patient not taking: Reported on 02/03/2014 07/13/13   Street, [place], PA-C  triamcinolone  cream (KENALOG ) 0.1 % Apply 1 application topically  2 (two) times daily. 11/02/17   Norris Will PARAS, PA-C  valACYclovir  (VALTREX ) 500 MG tablet Take 500 mg by mouth 2 (two) times daily.    [provider]    Family History Family History  Family history unknown: Yes    Social History Social History   Tobacco Use   Smoking status: Every Day    Current packs/day: 1.00    Types: Cigarettes   Smokeless tobacco: Never  Vaping Use   Vaping status: Never Used  Substance Use Topics   Alcohol use: Not Currently   Drug use: Yes    Frequency: 8.0 times per week    Types: Marijuana     Allergies   Penicillins   Review of Systems Review of Systems  Constitutional:  Negative for chills and fever.  Gastrointestinal:  Negative for abdominal pain.  Genitourinary:  Positive for frequency. Negative for dysuria, flank pain, hematuria, penile discharge and testicular pain.  Skin:  Negative for color change and rash.     Physical Exam Triage Vital Signs ED Triage Vitals  Encounter Vitals Group     BP      Girls Systolic BP Percentile      Girls Diastolic BP Percentile      Boys Systolic BP Percentile      Boys Diastolic BP Percentile      Pulse      Resp      Temp  Temp src      SpO2      Weight      Height      Head Circumference      Peak Flow      Pain Score      Pain Loc      Pain Education      Exclude from Growth Chart    No data found.  Updated Vital Signs BP 133/79   Pulse 89   Temp 98 F (36.7 C)   Resp 19   SpO2 98%   Visual Acuity Right Eye Distance:   Left Eye Distance:   Bilateral Distance:    Right Eye Near:   Left Eye Near:    Bilateral Near:     Physical Exam Constitutional:      General: He is not in acute distress. HENT:     Mouth/Throat:     Mouth: Mucous membranes are moist.  Cardiovascular:     Rate and Rhythm: Normal rate.  Pulmonary:     Effort: Pulmonary effort is normal. No respiratory distress.  Abdominal:     General: Bowel sounds are normal.      Palpations: Abdomen is soft.     Tenderness: There is no abdominal tenderness. There is no right CVA tenderness, left CVA tenderness, guarding or rebound.  Neurological:     Mental Status: He is alert.      UC Treatments / Results  Labs (all labs ordered are listed, but only abnormal results are displayed) Labs Reviewed  POCT URINE DIPSTICK - Abnormal; Notable for the following components:      Result Value   Color, UA orange (*)    Ketones, POC UA moderate (40) (*)    Spec Grav, UA >=1.030 (*)    Protein Ur, POC =100 (*)    Urobilinogen, UA 4.0 (*)    All other components within normal limits  CYTOLOGY, (ORAL, ANAL, URETHRAL) ANCILLARY ONLY    EKG   Radiology No results found.  Procedures Procedures (including critical care time)  Medications Ordered in UC Medications - No data to display  Initial Impression / Assessment and Plan / UC Course  I have reviewed the triage vital signs and the nursing notes.  Pertinent labs & imaging results that were available during my care of the patient were reviewed by me and considered in my medical decision making (see chart for details).    Urinary frequency, exposure to trichomonas, STD screening.  Afebrile and vital signs are stable.  Patient reports exposure to trichomonas from his girlfriend.  Treating today with metronidazole.  Patient obtained urethral self swab for STD testing.  Discussed that his test results will be available in his MyChart account and that we will call if treatment changes needed.  Instructed him to abstain from all sexual activity until his test results are back and all treatment is completed if needed.  Education provided on trichomonas.  He agrees to plan of care.  Final Clinical Impressions(s) / UC Diagnoses   Final diagnoses:  Urinary frequency  Exposure to trichomonas  Screening for STD (sexually transmitted disease)     Discharge Instructions      Your tests are pending.  Your test results  will be available on your MyChart account.  If your test results are positive, we will call you.  You and your sexual partner(s) may require treatment at that time.  Do not have sexual activity until all test results are back and treatment  is completed if needed.       ED Prescriptions     Medication Sig Dispense Auth. Provider   metroNIDAZOLE (FLAGYL) 500 MG tablet Take 1 tablet (500 mg total) by mouth 2 (two) times daily. 14 tablet Corlis Burnard DEL, NP      PDMP not reviewed this encounter.   Corlis Burnard DEL, NP 11/06/23 [phone]

## 2023-11-09 LAB — CYTOLOGY, (ORAL, ANAL, URETHRAL) ANCILLARY ONLY
Chlamydia: NEGATIVE
Comment: NEGATIVE
Comment: NEGATIVE
Comment: NORMAL
Neisseria Gonorrhea: NEGATIVE
Trichomonas: NEGATIVE

## 2023-12-02 ENCOUNTER — Telehealth: Payer: Self-pay | Admitting: Nurse Practitioner

## 2023-12-02 NOTE — Telephone Encounter (Signed)
 Lm and sent MyChart message:  [facility]: Jeremy Dixon will be out of the office on December 19th.  Please call the office or reschedule your appointment through MyChart.   E2C2 please reschedule appt

## 2023-12-25 ENCOUNTER — Ambulatory Visit: Admitting: Nurse Practitioner

## 2024-02-11 ENCOUNTER — Ambulatory Visit: Admitting: Nurse Practitioner
